# Patient Record
Sex: Female | Born: 1975 | Race: White | Hispanic: No | Marital: Married | Smoking: Never smoker
Health system: Southern US, Community
[De-identification: ages and names within clinical notes are randomized; demographics above are authoritative.]

## PROBLEM LIST (undated history)

## (undated) DIAGNOSIS — Z8489 Family history of other specified conditions: Secondary | ICD-10-CM

## (undated) DIAGNOSIS — I7771 Dissection of carotid artery: Secondary | ICD-10-CM

## (undated) DIAGNOSIS — T7840XA Allergy, unspecified, initial encounter: Secondary | ICD-10-CM

## (undated) DIAGNOSIS — I773 Arterial fibromuscular dysplasia: Secondary | ICD-10-CM

## (undated) DIAGNOSIS — Z9889 Other specified postprocedural states: Secondary | ICD-10-CM

## (undated) DIAGNOSIS — R112 Nausea with vomiting, unspecified: Secondary | ICD-10-CM

## (undated) DIAGNOSIS — M810 Age-related osteoporosis without current pathological fracture: Secondary | ICD-10-CM

## (undated) DIAGNOSIS — R202 Paresthesia of skin: Secondary | ICD-10-CM

## (undated) DIAGNOSIS — R51 Headache: Secondary | ICD-10-CM

## (undated) DIAGNOSIS — E162 Hypoglycemia, unspecified: Secondary | ICD-10-CM

## (undated) DIAGNOSIS — N809 Endometriosis, unspecified: Secondary | ICD-10-CM

## (undated) DIAGNOSIS — G8929 Other chronic pain: Secondary | ICD-10-CM

## (undated) DIAGNOSIS — I639 Cerebral infarction, unspecified: Secondary | ICD-10-CM

## (undated) HISTORY — DX: Hypoglycemia, unspecified: E16.2

## (undated) HISTORY — DX: Other chronic pain: G89.29

## (undated) HISTORY — PX: WISDOM TOOTH EXTRACTION: SHX21

## (undated) HISTORY — PX: APPENDECTOMY: SHX54

## (undated) HISTORY — DX: Arterial fibromuscular dysplasia: I77.3

## (undated) HISTORY — DX: Dissection of carotid artery: I77.71

## (undated) HISTORY — DX: Cerebral infarction, unspecified: I63.9

## (undated) HISTORY — DX: Paresthesia of skin: R20.2

## (undated) HISTORY — DX: Endometriosis, unspecified: N80.9

## (undated) HISTORY — DX: Age-related osteoporosis without current pathological fracture: M81.0

## (undated) HISTORY — DX: Allergy, unspecified, initial encounter: T78.40XA

## (undated) HISTORY — PX: DILATION AND CURETTAGE OF UTERUS: SHX78

## (undated) HISTORY — DX: Headache: R51

---

## 2002-04-02 ENCOUNTER — Other Ambulatory Visit: Admission: RE | Admit: 2002-04-02 | Discharge: 2002-04-02 | Payer: Self-pay | Admitting: *Deleted

## 2003-06-14 ENCOUNTER — Other Ambulatory Visit: Admission: RE | Admit: 2003-06-14 | Discharge: 2003-06-14 | Payer: Self-pay | Admitting: Obstetrics and Gynecology

## 2004-03-16 ENCOUNTER — Ambulatory Visit: Payer: Self-pay | Admitting: Family Medicine

## 2004-06-16 ENCOUNTER — Other Ambulatory Visit: Admission: RE | Admit: 2004-06-16 | Discharge: 2004-06-16 | Payer: Self-pay | Admitting: Obstetrics and Gynecology

## 2005-04-23 ENCOUNTER — Other Ambulatory Visit: Admission: RE | Admit: 2005-04-23 | Discharge: 2005-04-23 | Payer: Self-pay | Admitting: Obstetrics and Gynecology

## 2005-11-22 ENCOUNTER — Ambulatory Visit: Payer: Self-pay | Admitting: Family Medicine

## 2006-06-22 ENCOUNTER — Ambulatory Visit: Payer: Self-pay | Admitting: Family Medicine

## 2006-06-22 LAB — CONVERTED CEMR LAB
Albumin: 4 g/dL (ref 3.5–5.2)
BUN: 7 mg/dL (ref 6–23)
Basophils Absolute: 0 10*3/uL (ref 0.0–0.1)
Basophils Relative: 0.4 % (ref 0.0–1.0)
CO2: 30 meq/L (ref 19–32)
Chloride: 111 meq/L (ref 96–112)
Cholesterol: 201 mg/dL (ref 0–200)
Creatinine, Ser: 0.7 mg/dL (ref 0.4–1.2)
Direct LDL: 75.1 mg/dL
HCT: 39 % (ref 36.0–46.0)
Hemoglobin: 13.8 g/dL (ref 12.0–15.0)
Monocytes Absolute: 0.4 10*3/uL (ref 0.2–0.7)
Monocytes Relative: 10.6 % (ref 3.0–11.0)
Potassium: 4 meq/L (ref 3.5–5.1)
RBC: 4.29 M/uL (ref 3.87–5.11)
RDW: 11.9 % (ref 11.5–14.6)
Sodium: 144 meq/L (ref 135–145)
TSH: 1.45 microintl units/mL (ref 0.35–5.50)
Total Bilirubin: 1.1 mg/dL (ref 0.3–1.2)
Total CHOL/HDL Ratio: 2
Total Protein: 6.9 g/dL (ref 6.0–8.3)
Triglycerides: 61 mg/dL (ref 0–149)
VLDL: 12 mg/dL (ref 0–40)

## 2006-07-15 ENCOUNTER — Ambulatory Visit: Payer: Self-pay | Admitting: Family Medicine

## 2006-07-26 ENCOUNTER — Encounter: Payer: Self-pay | Admitting: Family Medicine

## 2007-05-03 ENCOUNTER — Encounter: Admission: RE | Admit: 2007-05-03 | Discharge: 2007-05-03 | Payer: Self-pay | Admitting: Allergy and Immunology

## 2008-02-01 ENCOUNTER — Ambulatory Visit: Payer: Self-pay | Admitting: Family Medicine

## 2008-02-01 DIAGNOSIS — J45909 Unspecified asthma, uncomplicated: Secondary | ICD-10-CM | POA: Insufficient documentation

## 2008-02-01 DIAGNOSIS — R519 Headache, unspecified: Secondary | ICD-10-CM | POA: Insufficient documentation

## 2008-02-01 DIAGNOSIS — Z8639 Personal history of other endocrine, nutritional and metabolic disease: Secondary | ICD-10-CM

## 2008-02-01 DIAGNOSIS — R51 Headache: Secondary | ICD-10-CM

## 2008-02-01 DIAGNOSIS — Z862 Personal history of diseases of the blood and blood-forming organs and certain disorders involving the immune mechanism: Secondary | ICD-10-CM

## 2008-02-01 DIAGNOSIS — J019 Acute sinusitis, unspecified: Secondary | ICD-10-CM

## 2008-02-01 DIAGNOSIS — J309 Allergic rhinitis, unspecified: Secondary | ICD-10-CM | POA: Insufficient documentation

## 2008-02-02 ENCOUNTER — Ambulatory Visit: Payer: Self-pay | Admitting: Family Medicine

## 2008-02-02 ENCOUNTER — Telehealth: Payer: Self-pay | Admitting: Family Medicine

## 2008-02-17 ENCOUNTER — Emergency Department (HOSPITAL_BASED_OUTPATIENT_CLINIC_OR_DEPARTMENT_OTHER): Admission: EM | Admit: 2008-02-17 | Discharge: 2008-02-17 | Payer: Self-pay | Admitting: Emergency Medicine

## 2008-02-17 ENCOUNTER — Ambulatory Visit: Payer: Self-pay | Admitting: Radiology

## 2008-03-20 ENCOUNTER — Ambulatory Visit: Payer: Self-pay | Admitting: Family Medicine

## 2008-11-15 ENCOUNTER — Ambulatory Visit: Payer: Self-pay | Admitting: Family Medicine

## 2008-11-15 DIAGNOSIS — T753XXA Motion sickness, initial encounter: Secondary | ICD-10-CM

## 2009-01-27 ENCOUNTER — Ambulatory Visit: Payer: Self-pay | Admitting: Family Medicine

## 2009-01-27 DIAGNOSIS — G4700 Insomnia, unspecified: Secondary | ICD-10-CM | POA: Insufficient documentation

## 2009-01-29 ENCOUNTER — Ambulatory Visit: Payer: Self-pay | Admitting: Family Medicine

## 2009-01-29 DIAGNOSIS — R079 Chest pain, unspecified: Secondary | ICD-10-CM | POA: Insufficient documentation

## 2009-01-30 ENCOUNTER — Ambulatory Visit: Payer: Self-pay | Admitting: Internal Medicine

## 2009-02-03 ENCOUNTER — Telehealth: Payer: Self-pay | Admitting: Family Medicine

## 2009-02-03 ENCOUNTER — Telehealth (INDEPENDENT_AMBULATORY_CARE_PROVIDER_SITE_OTHER): Payer: Self-pay | Admitting: *Deleted

## 2009-02-05 ENCOUNTER — Telehealth: Payer: Self-pay | Admitting: Family Medicine

## 2009-02-06 ENCOUNTER — Encounter: Payer: Self-pay | Admitting: Family Medicine

## 2010-01-10 ENCOUNTER — Ambulatory Visit
Admission: RE | Admit: 2010-01-10 | Discharge: 2010-01-10 | Payer: Self-pay | Source: Home / Self Care | Attending: Family Medicine | Admitting: Family Medicine

## 2010-01-12 ENCOUNTER — Ambulatory Visit
Admission: RE | Admit: 2010-01-12 | Discharge: 2010-01-12 | Payer: Self-pay | Source: Home / Self Care | Attending: Family Medicine | Admitting: Family Medicine

## 2010-02-01 LAB — CONVERTED CEMR LAB
Albumin: 4.4 g/dL (ref 3.5–5.2)
BUN: 7 mg/dL (ref 6–23)
Basophils Absolute: 0 10*3/uL (ref 0.0–0.1)
Basophils Relative: 0 % (ref 0.0–3.0)
CO2: 28 meq/L (ref 19–32)
Calcium: 9.7 mg/dL (ref 8.4–10.5)
Chloride: 105 meq/L (ref 96–112)
Creatinine, Ser: 0.8 mg/dL (ref 0.4–1.2)
Eosinophils Absolute: 0 10*3/uL (ref 0.0–0.7)
Free T4: 0.7 ng/dL (ref 0.6–1.6)
Glucose, Bld: 97 mg/dL (ref 70–99)
Lymphocytes Relative: 7.9 % — ABNORMAL LOW (ref 12.0–46.0)
MCHC: 33.7 g/dL (ref 30.0–36.0)
MCV: 93.7 fL (ref 78.0–100.0)
Monocytes Absolute: 0.4 10*3/uL (ref 0.1–1.0)
Neutrophils Relative %: 88.3 % — ABNORMAL HIGH (ref 43.0–77.0)
RBC: 4.51 M/uL (ref 3.87–5.11)
RDW: 12.3 % (ref 11.5–14.6)
T3, Free: 2.7 pg/mL (ref 2.3–4.2)
TSH: 0.59 microintl units/mL (ref 0.35–5.50)
Total Protein: 7.5 g/dL (ref 6.0–8.3)

## 2010-02-03 NOTE — Progress Notes (Signed)
Summary: Pt req a letter for medical leave of absence to [facility]  Phone Note Call from Patient Call back at Home Phone [phone]   Caller: Patient Summary of Call: Pt called and said that she has had to discontine her current classes at [employer] on account of her illness and present condition. Pt is req that Dr. Clent Ridges write a medical leave of absense so it does not go against pts academic record. Please fax letter to [phone] or pt can pick up letter if needed.  Initial call taken by: Lucy Antigua,  February 05, 2009 2:28 PM  Follow-up for Phone Call        I will be happy to do this but ask her when was the last day she went to class, and when does she expect to go back? Follow-up by: Nelwyn Salisbury MD,  February 06, 2009 8:28 AM  Additional Follow-up for Phone Call Additional follow up Details #1::        requesting medical leave for the spring semester cause she has already exceeded her days of being out.  Her last day was 01/23/09 Additional Follow-up by: Alfred Levins, CMA,  February 06, 2009 11:19 AM    Additional Follow-up for Phone Call Additional follow up Details #2::    please tell her that I wrote this letter today and that we will fax it in as above Follow-up by: Nelwyn Salisbury MD,  February 06, 2009 12:02 PM  Additional Follow-up for Phone Call Additional follow up Details #3:: Details for Additional Follow-up Action Taken: pt aware Additional Follow-up by: Alfred Levins, CMA,  February 06, 2009 12:20 PM

## 2010-02-03 NOTE — Letter (Signed)
Summary: Excuse from Class for Spring Semester  Excuse from Class for Spring Semester   Imported By: Maryln Gottron 02/10/2009 11:24:24  _____________________________________________________________________  External Attachment:    Type:   Image     Comment:   External Document

## 2010-02-03 NOTE — Assessment & Plan Note (Signed)
Summary: ASTHMA RELATED ISSUES // RS   Vital Signs:  Patient profile:   35 year old female Weight:      101 pounds Temp:     97.9 degrees F oral BP sitting:   116 / 90  (left arm) Cuff size:   regular  Vitals Entered By: Alfred Levins, CMA (January 27, 2009 2:24 PM) CC: asthma x1 wk, physically tired but can't sleep   History of Present Illness: Here with questions about asthma and sleep problems. She has had some trouble with sleep off and on for years, usually falling asleep quickly but waking up in the middle of the night. She would like to avoid sleeping meds if possible. She does avoid any caffeine use except in the early mornings. Also she asks if she can come off any of her asthma meds. She sees Dr. Corinda Gubler for this, and her asthma has been very well controlled. She has not used her rescue inhaler more than one time in the past 3 months. She has had a flare of her allergies in the past week with itchy eyes and a stuffy head. No fever.   Current Medications (verified): 1)  Allegra 180 Mg Tabs (Fexofenadine Hcl) .Marland Kitchen.. 1 By Mouth Once Daily 2)  Yaz 3-0.02 Mg  Tabs (Drospirenone-Ethinyl Estradiol) .Marland Kitchen.. 1 By Mouth Once Daily 3)  Symbicort 80-4.5 Mcg/act  Aero (Budesonide-Formoterol Fumarate) .Marland Kitchen.. 1 Puff Two Times A Day 4)  Proair Hfa 108 (90 Base) Mcg/act Aers (Albuterol Sulfate) .... 2 Puffs Q 4 Hours As Needed  Allergies (verified): 1)  ! * Omnicef 2)  ! Codeine 3)  ! Ceftin  Past History:  Past Medical History: Reviewed history from 11/15/2008 and no changes required. Allergic rhinitis Asthma, sees Dr. Stevphen Rochester Headache Chickenpox Hypoglycemia sees Dr. Su Hilt for GYN exams  Review of Systems  The patient denies anorexia, fever, weight loss, weight gain, vision loss, decreased hearing, hoarseness, chest pain, syncope, dyspnea on exertion, peripheral edema, prolonged cough, headaches, hemoptysis, abdominal pain, melena, hematochezia, severe indigestion/heartburn,  hematuria, incontinence, genital sores, muscle weakness, suspicious skin lesions, transient blindness, difficulty walking, depression, unusual weight change, abnormal bleeding, enlarged lymph nodes, angioedema, breast masses, and testicular masses.    Physical Exam  General:  Well-developed,well-nourished,in no acute distress; alert,appropriate and cooperative throughout examination Head:  Normocephalic and atraumatic without obvious abnormalities. No apparent alopecia or balding. Eyes:  No corneal or conjunctival inflammation noted. EOMI. Perrla. Funduscopic exam benign, without hemorrhages, exudates or papilledema. Vision grossly normal. Ears:  External ear exam shows no significant lesions or deformities.  Otoscopic examination reveals clear canals, tympanic membranes are intact bilaterally without bulging, retraction, inflammation or discharge. Hearing is grossly normal bilaterally. Nose:  External nasal examination shows no deformity or inflammation. Nasal mucosa are pink and moist without lesions or exudates. Mouth:  Oral mucosa and oropharynx without lesions or exudates.  Teeth in good repair. Neck:  No deformities, masses, or tenderness noted. Lungs:  Normal respiratory effort, chest expands symmetrically. Lungs are clear to auscultation, no crackles or wheezes. Psych:  Oriented X3, memory intact for recent and remote, normally interactive, good eye contact, and not anxious appearing.     Impression & Recommendations:  Problem # 1:  ASTHMA (ICD-493.90)  The following medications were removed from the medication list:    Symbicort 80-4.5 Mcg/act Aero (Budesonide-formoterol fumarate) .Marland Kitchen... 1 puff two times a day Her updated medication list for this problem includes:    Proair Hfa 108 (90 Base) Mcg/act Aers (Albuterol sulfate) .Marland KitchenMarland KitchenMarland KitchenMarland Kitchen  2 puffs q 4 hours as needed    Flovent Hfa 44 Mcg/act Aero (Fluticasone propionate  hfa) .Marland Kitchen... 2 puffs once daily    Prednisone (pak) 10 Mg Tabs (Prednisone)  .Marland Kitchen... As directed for 6 days  Problem # 2:  ALLERGIC RHINITIS (ICD-477.9)  The following medications were removed from the medication list:    Promethazine Hcl 25 Mg Tabs (Promethazine hcl) .Marland Kitchen... 1 q 4 hours as needed nausea Her updated medication list for this problem includes:    Allegra 180 Mg Tabs (Fexofenadine hcl) .Marland Kitchen... 1 by mouth once daily  Problem # 3:  INSOMNIA (ICD-780.52)  Complete Medication List: 1)  Allegra 180 Mg Tabs (Fexofenadine hcl) .Marland Kitchen.. 1 by mouth once daily 2)  Yaz 3-0.02 Mg Tabs (Drospirenone-ethinyl estradiol) .Marland Kitchen.. 1 by mouth once daily 3)  Proair Hfa 108 (90 Base) Mcg/act Aers (Albuterol sulfate) .... 2 puffs q 4 hours as needed 4)  Flovent Hfa 44 Mcg/act Aero (Fluticasone propionate  hfa) .... 2 puffs once daily 5)  Prednisone (pak) 10 Mg Tabs (Prednisone) .... As directed for 6 days  Patient Instructions: 1)  Her asthma seems well controlled but instead of stopping her maintenance inhaler altogether, we will switch to a maintenance steroid inhaler and get rid of the long term beta agonist. try melatonin OTC for sleep. 2)  Please schedule a follow-up appointment in 2 weeks.  Prescriptions: PREDNISONE (PAK) 10 MG TABS (PREDNISONE) as directed for 6 days  #1 x 0   Entered and Authorized by:   Nelwyn Salisbury MD   Signed by:   Nelwyn Salisbury MD on 01/27/2009   Method used:   Electronically to        [facility] # [address]* (retail)       [address]       [place], [place]  [address]       Ph: [phone]       Fax: [phone]   RxID:   [id number]

## 2010-02-03 NOTE — Progress Notes (Signed)
Summary: Pt needs to know if she needs to keep ov on 02/10/09  Phone Note Call from Patient Call back at Home Phone [phone]   Caller: Patient Summary of Call: Pt is sch for a follow up appt on 02/10/09 at 3:30. Pt is wondering if she still needs to keep this appt, since Dr. Clent Ridges has now referred her to an alergist.  Pt has appt with Dr. Stevphen Rochester tomorrow. Please adivise.  Initial call taken by: Lucy Antigua,  February 03, 2009 9:07 AM  Follow-up for Phone Call        appt cancelled, pt aware Follow-up by: Alfred Levins, CMA,  February 03, 2009 9:11 AM

## 2010-02-03 NOTE — Assessment & Plan Note (Signed)
Summary: chest pain/dm   Vital Signs:  Patient profile:   35 year old female Height:      60 inches Weight:      101 pounds BMI:     19.80 O2 Sat:      99 % on Room air Temp:     98.9 degrees F oral Pulse rate:   105 / minute BP sitting:   140 / 106  (left arm) Cuff size:   regular  Vitals Entered By: Alfred Levins, CMA (January 29, 2009 4:11 PM)  O2 Flow:  Room air CC: chest pain, sob, sweating alot, feels like legs are shaking inside   History of Present Illness: Here with her husband for a number of symptoms that have worsened in the past 48 hours. I saw her here 2 days ago with some conerns over sleep problems and questions about asthma. The asthma seemed to be doing well, so we switched from Symbicort to Flovent. She had some allergy problems with sneezing, itchy eyes , etc. so we started her on a prednisone taper pack. Now over the past 48 hours she has developed some yellow nasal DC, a dry cough, chills, some SOB, and sharp pains across the chest. Pains are constant but they wax and wane. It hurts to take a deep breath. No nausea or heartburn. her heart rate is quite rapid, and she is sweating a lot. She has used her albuterol inhaler about 10 times in the past 2 days.   Current Medications (verified): 1)  Allegra 180 Mg Tabs (Fexofenadine Hcl) .Marland Kitchen.. 1 By Mouth Once Daily 2)  Yaz 3-0.02 Mg  Tabs (Drospirenone-Ethinyl Estradiol) .Marland Kitchen.. 1 By Mouth Once Daily 3)  Proair Hfa 108 (90 Base) Mcg/act Aers (Albuterol Sulfate) .... 2 Puffs Q 4 Hours As Needed 4)  Flovent Hfa 44 Mcg/act Aero (Fluticasone Propionate  Hfa) .... 2 Puffs Once Daily 5)  Prednisone (Pak) 10 Mg Tabs (Prednisone) .... As Directed For 6 Days  Allergies (verified): 1)  ! * Omnicef 2)  ! Codeine 3)  ! Ceftin  Past History:  Past Medical History: Reviewed history from 11/15/2008 and no changes required. Allergic rhinitis Asthma, sees Dr. Stevphen Rochester Headache Chickenpox Hypoglycemia sees Dr. Su Hilt for  GYN exams  Past Surgical History: Reviewed history from 02/01/2008 and no changes required. Appendectomy  Review of Systems  The patient denies anorexia, fever, weight loss, weight gain, vision loss, decreased hearing, hoarseness, syncope, peripheral edema, headaches, hemoptysis, abdominal pain, melena, hematochezia, severe indigestion/heartburn, hematuria, incontinence, genital sores, muscle weakness, suspicious skin lesions, transient blindness, difficulty walking, depression, unusual weight change, abnormal bleeding, enlarged lymph nodes, angioedema, breast masses, and testicular masses.    Physical Exam  General:  alert, sweaty, very anxious Head:  Normocephalic and atraumatic without obvious abnormalities. No apparent alopecia or balding. Eyes:  No corneal or conjunctival inflammation noted. EOMI. Perrla. Funduscopic exam benign, without hemorrhages, exudates or papilledema. Vision grossly normal. Ears:  External ear exam shows no significant lesions or deformities.  Otoscopic examination reveals clear canals, tympanic membranes are intact bilaterally without bulging, retraction, inflammation or discharge. Hearing is grossly normal bilaterally. Nose:  External nasal examination shows no deformity or inflammation. Nasal mucosa are pink and moist without lesions or exudates. Mouth:  Oral mucosa and oropharynx without lesions or exudates.  Teeth in good repair. Neck:  No deformities, masses, or tenderness noted. Chest Wall:  very tender across the entire chest Lungs:  Normal respiratory effort, chest expands symmetrically. Lungs are clear to  auscultation, no crackles or wheezes. Heart:  rapid  rate and regular rhythm. S1 and S2 normal without gallop, murmur, click, rub or other extra sounds. EKG normal Abdomen:  Bowel sounds positive,abdomen soft and non-tender without masses, organomegaly or hernias noted.   Impression & Recommendations:  Problem # 1:  CHEST PAIN  (ICD-786.50)  Orders: Venipuncture ([address]) TLB-BMP (Basic Metabolic Panel-BMET) (80048-METABOL) TLB-CBC Platelet - w/Differential (85025-CBCD) TLB-Hepatic/Liver Function Pnl (80076-HEPATIC) TLB-TSH (Thyroid Stimulating Hormone) (84443-TSH) TLB-T4 (Thyrox), Free (84439-FT4R) TLB-T3, Free (Triiodothyronine) (84481-T3FREE) EKG w/ Interpretation (93000) Radiology Referral (Radiology)  Problem # 2:  INSOMNIA (ICD-780.52)  Problem # 3:  ASTHMA (ICD-493.90)  Her updated medication list for this problem includes:    Proair Hfa 108 (90 Base) Mcg/act Aers (Albuterol sulfate) .Marland Kitchen... 2 puffs q 4 hours as needed    Flovent Hfa 44 Mcg/act Aero (Fluticasone propionate  hfa) .Marland Kitchen... 2 puffs once daily    Prednisone (pak) 10 Mg Tabs (Prednisone) .Marland Kitchen... As directed for 6 days  Problem # 4:  ALLERGIC RHINITIS (ICD-477.9)  Her updated medication list for this problem includes:    Allegra 180 Mg Tabs (Fexofenadine hcl) .Marland Kitchen... 1 by mouth once daily  Complete Medication List: 1)  Allegra 180 Mg Tabs (Fexofenadine hcl) .Marland Kitchen.. 1 by mouth once daily 2)  Yaz 3-0.02 Mg Tabs (Drospirenone-ethinyl estradiol) .Marland Kitchen.. 1 by mouth once daily 3)  Proair Hfa 108 (90 Base) Mcg/act Aers (Albuterol sulfate) .... 2 puffs q 4 hours as needed 4)  Flovent Hfa 44 Mcg/act Aero (Fluticasone propionate  hfa) .... 2 puffs once daily 5)  Prednisone (pak) 10 Mg Tabs (Prednisone) .... As directed for 6 days  Patient Instructions: 1)  Her chest pain is consistent with costochondritis, which has come on the heels of a viral URI. This would explain the chills, cough, sweating, etc. The rapid heart rate may come from the prednisone and/or using too much albuterol. Since she has been on a BCP for awhile, there is a possibility of PE as well, though less likely given her normal oxygen sats today. Advised her to stop using albuterol unless she truly feels tight or if she is wheezing. Finish out the prednisone pack. Get labs today to check her  thyroid status and a CBC. Will set up a contrasted chest CT as well to rule out PE. She knows to go to the ER tonight if she gets more SOB or the pain gets any worse.

## 2010-02-03 NOTE — Progress Notes (Signed)
Summary: Pt coming in tomorrow to pick up copy of recent ov notes and lab  Phone Note Call from Patient Call back at Home Phone [phone]   Caller: Patient Summary of Call: Pt is going to come by the office tomorrow morning to sign a release to get copy of recent ov notes and labs, because she has a doctors appt tomorrow at 11:30 w/ Dr. Stevphen Rochester.  Initial call taken by: Lucy Antigua,  February 03, 2009 1:33 PM  Follow-up for Phone Call       Follow-up by: Alfred Levins, CMA,  February 03, 2009 1:52 PM  Additional Follow-up for Phone Call Additional follow up Details #1::        Phone Call Completed Additional Follow-up by: Drue Stager,  February 05, 2009 2:34 PM

## 2010-02-05 NOTE — Assessment & Plan Note (Signed)
Summary: CANT TALK/VJ   Vital Signs:  Patient profile:   35 year old female Height:      60 inches Weight:      91 pounds BMI:     17.84 O2 Sat:      97 % on Room air Temp:     98.4 degrees F oral Pulse rate:   95 / minute BP sitting:   110 / 70  (left arm) Cuff size:   regular  Vitals Entered By: Payton Spark CMA (January 10, 2010 11:14 AM)  O2 Flow:  Room air CC: Allergies, fatigued, and hoarse x 5 days.   CC:  Allergies, fatigued, and and hoarse x 5 days.Marland Kitchen  History of Present Illness: R is a 35 y/o fem ns w h/o of ar/asthma who comes in for eval of flare of athma x 4 days.triggered gby a viral inf...schoolmteacher  Current Medications (verified): 1)  Allegra 180 Mg Tabs (Fexofenadine Hcl) .Marland Kitchen.. 1 By Mouth Once Daily 2)  Proair Hfa 108 (90 Base) Mcg/act Aers (Albuterol Sulfate) .... 2 Puffs Q 4 Hours As Needed 3)  Flovent Hfa 44 Mcg/act Aero (Fluticasone Propionate  Hfa) .... 2 Puffs Once Daily 4)  Allegra-D Allergy & Congestion 180-240 Mg Xr24h-Tab (Fexofenadine-Pseudoephedrine) .... Take 1 Tab By Mouth Once Daily  Allergies (verified): 1)  ! * Omnicef 2)  ! Codeine 3)  ! Ceftin  Past History:  Past medical, surgical, family and social histories (including risk factors) reviewed for relevance to current acute and chronic problems.  Past Medical History: Reviewed history from 11/15/2008 and no changes required. Allergic rhinitis Asthma, sees Dr. Stevphen Rochester Headache Chickenpox Hypoglycemia sees Dr. Su Hilt for GYN exams  Past Surgical History: Reviewed history from 02/01/2008 and no changes required. Appendectomy  Family History: Reviewed history from 02/01/2008 and no changes required. Family History Hypertension Hypoglycemia Family History of CAD Female 1st degree relative <50 Family History Diabetes 1st degree relative  Social History: Reviewed history from 02/01/2008 and no changes required. Married Never Smoked Alcohol use-no Drug  use-no  Review of Systems      See HPI  Physical Exam  General:  Well-developed,well-nourished,in no acute distress; alert,appropriate and cooperative throughout examination Head:  Normocephalic and atraumatic without obvious abnormalities. No apparent alopecia or balding. Eyes:  No corneal or conjunctival inflammation noted. EOMI. Perrla. Funduscopic exam benign, without hemorrhages, exudates or papilledema. Vision grossly normal. Ears:  External ear exam shows no significant lesions or deformities.  Otoscopic examination reveals clear canals, tympanic membranes are intact bilaterally without bulging, retraction, inflammation or discharge. Hearing is grossly normal bilaterally. Nose:  External nasal examination shows no deformity or inflammation. Nasal mucosa are pink and moist without lesions or exudates. Mouth:  Oral mucosa and oropharynx without lesions or exudates.  Teeth in good repair. Neck:  No deformities, masses, or tenderness noted. Chest Wall:  No deformities, masses, or tenderness noted. Lungs:  symBS.....bilat. wheezing..mild   Impression & Recommendations:  Problem # 1:  ASTHMA (ICD-493.90) Assessment Deteriorated  Her updated medication list for this problem includes:    Proair Hfa 108 (90 Base) Mcg/act Aers (Albuterol sulfate) .Marland Kitchen... 2 puffs q 4 hours as needed    Flovent Hfa 44 Mcg/act Aero (Fluticasone propionate  hfa) .Marland Kitchen... 2 puffs once daily    Prednisone 20 Mg Tabs (Prednisone) ..... Uad  Complete Medication List: 1)  Allegra 180 Mg Tabs (Fexofenadine hcl) .Marland Kitchen.. 1 by mouth once daily 2)  Proair Hfa 108 (90 Base) Mcg/act Aers (Albuterol sulfate) .Marland KitchenMarland KitchenMarland Kitchen  2 puffs q 4 hours as needed 3)  Flovent Hfa 44 Mcg/act Aero (Fluticasone propionate  hfa) .... 2 puffs once daily 4)  Allegra-d Allergy & Congestion 180-240 Mg Xr24h-tab (Fexofenadine-pseudoephedrine) .... Take 1 tab by mouth once daily 5)  Prednisone 20 Mg Tabs (Prednisone) .... Uad 6)  Hydromet 5-1.5 Mg/41ml Syrp  (Hydrocodone-homatropine) .... 1/2 to 1 tsp three times a day prn  Patient Instructions: 1)  3 pred. now,, then 2 qam 2)  drink lots of water 3)  cont allg meds 4)  hydromet 1/2 to 1 tsp three times a day as needed 5)  see Dr. Clent Ridges mon for follow up Prescriptions: HYDROMET 5-1.5 MG/5ML SYRP (HYDROCODONE-HOMATROPINE) 1/2 to 1 tsp three times a day prn  #8oz x 1   Entered and Authorized by:   Roderick Pee MD   Signed by:   Roderick Pee MD on 01/10/2010   Method used:   Print then Give to Patient   RxID:   [id number] PREDNISONE 20 MG TABS (PREDNISONE) UAD  #50 x 1   Entered and Authorized by:   Roderick Pee MD   Signed by:   Roderick Pee MD on 01/10/2010   Method used:   Electronically to        [facility] [id number]* (retail)       [address]       [place], [place]  [address]       Ph: [phone]       Fax: [phone]   RxID:   [id number]    Orders Added: 1)  Est. Patient Level III [78242]

## 2010-02-05 NOTE — Assessment & Plan Note (Signed)
Summary: [facility]US [facility]FE[facility]//[facility]   Vital Signs:  Patient profile:   35 year old female Weight:      114 pounds O2 Sat:      99 % Temp:     98.8 degrees F Pulse rate:   114 / minute BP sitting:   110 / 74  (left arm)  Vitals Entered By: Pura Spice, RN (January 12, 2010 1:26 PM) CC: was [facility] inf. was seen by Dr Tawanna Cooler was given prednisone and hycodan    History of Present Illness: Here for one week of sinus pressure, PND, ST, hoarseness, and coughing up green sputum. No fever. Was seen in the Sat. clinic, and was given cough syrup and a prednisone dose pack. She is no better today.   Allergies: 1)  ! * Omnicef 2)  ! Codeine 3)  ! Ceftin  Past History:  Past Medical History: Reviewed history from 11/15/2008 and no changes required. Allergic rhinitis Asthma, sees Dr. Stevphen [place] Headache Chickenpox Hypoglycemia sees Dr. Su Hilt for GYN exams  Review of Systems  The patient denies anorexia, fever, weight loss, weight gain, vision loss, decreased hearing, hoarseness, chest pain, syncope, dyspnea on exertion, peripheral edema, hemoptysis, abdominal pain, melena, hematochezia, severe indigestion/heartburn, hematuria, incontinence, genital sores, muscle weakness, suspicious skin lesions, transient blindness, difficulty walking, depression, unusual weight change, abnormal bleeding, enlarged lymph nodes, angioedema, breast masses, and testicular masses.    Physical Exam  General:  Well-developed,well-nourished,in no acute distress; alert,appropriate and cooperative throughout examination Head:  Normocephalic and atraumatic without obvious abnormalities. No apparent alopecia or balding. Eyes:  No corneal or conjunctival inflammation noted. EOMI. Perrla. Funduscopic exam benign, without hemorrhages, exudates or papilledema. Vision grossly normal. Ears:  External ear exam shows no significant lesions or deformities.  Otoscopic examination  reveals clear canals, tympanic membranes are intact bilaterally without bulging, retraction, inflammation or discharge. Hearing is grossly normal bilaterally. Nose:  External nasal examination shows no deformity or inflammation. Nasal mucosa are pink and moist without lesions or exudates. Mouth:  Oral mucosa and oropharynx without lesions or exudates.  Teeth in good repair. Neck:  No deformities, masses, or tenderness noted. Lungs:  soft wheezes. Very hoarse    Impression & Recommendations:  Problem # 1:  ACUTE SINUSITIS, UNSPECIFIED (ICD-461.9)  The following medications were removed from the medication list:    Allegra-d Allergy & Congestion 180-240 Mg Xr24h-tab (Fexofenadine-pseudoephedrine) .Marland Kitchen... Take 1 tab by mouth once daily    Hydromet 5-1.5 Mg/36ml Syrp (Hydrocodone-homatropine) .Marland Kitchen... 1/2 to 1 tsp three times a day prn Her updated medication list for this problem includes:    Allegra-d Allergy & Congestion 180-240 Mg Xr24h-tab (Fexofenadine-pseudoephedrine) ..... Once daily    Zithromax Z-pak 250 Mg Tabs (Azithromycin) .Marland Kitchen... As directed  Orders: Depo- Medrol 40mg  (J1030) Depo- Medrol 80mg  (J1040) Admin of Therapeutic Inj  intramuscular or subcutaneous (43329)  Complete Medication List: 1)  Proair Hfa 108 (90 Base) Mcg/act Aers (Albuterol sulfate) .... 2 puffs q 4 hours as needed 2)  Flovent Hfa 44 Mcg/act Aero (Fluticasone propionate  hfa) .... 2 puffs two times a day 3)  Allegra-d Allergy & Congestion 180-240 Mg Xr24h-tab (Fexofenadine-pseudoephedrine) .... Once daily 4)  Zithromax Z-pak 250 Mg Tabs (Azithromycin) .... As directed  Patient Instructions: 1)  Given a Zpack. Given  steroid shot, and she will stop the prednisone tablets. Stop the cough syrup and use Delsym instead. Off work today and tomorrow.  Prescriptions: ZITHROMAX Z-PAK 250  MG TABS (AZITHROMYCIN) as directed  #1 x 0   Entered and Authorized by:   Nelwyn Salisbury MD   Signed by:   Nelwyn Salisbury MD on  01/12/2010   Method used:   Print then Give to Patient   RxID:   [id number] ALLEGRA-D ALLERGY & CONGESTION 180-240 MG XR24H-TAB (FEXOFENADINE-PSEUDOEPHEDRINE) once daily  #30 x 11   Entered and Authorized by:   Nelwyn Salisbury MD   Signed by:   Nelwyn Salisbury MD on 01/12/2010   Method used:   Print then Give to Patient   RxID:   [id number]    Medication Administration  Injection # 1:    Medication: Depo- Medrol 40mg     Diagnosis: ACUTE SINUSITIS, UNSPECIFIED (ICD-461.9)    Route: IM    Site: RUOQ gluteus    Exp Date: 07/2012    Lot #: [id number]    Mfr: Pharmacia    Patient tolerated injection without complications    Given by: Pura Spice, RN (January 12, 2010 2:41 PM)  Injection # 2:    Medication: Depo- Medrol 80mg     Diagnosis: ACUTE SINUSITIS, UNSPECIFIED (ICD-461.9)    Route: IM    Site: RUOQ gluteus    Exp Date: 07/2012    Lot #: [id number]    Mfr: Pharmacia    Patient tolerated injection without complications    Given by: Pura Spice, RN (January 12, 2010 2:42 PM)  Orders Added: 1)  Est. Patient Level IV [95284] 2)  Depo- Medrol 40mg  [J1030] 3)  Depo- Medrol 80mg  [J1040] 4)  Admin of Therapeutic Inj  intramuscular or subcutaneous [13244]

## 2010-05-11 ENCOUNTER — Ambulatory Visit (INDEPENDENT_AMBULATORY_CARE_PROVIDER_SITE_OTHER): Payer: BC Managed Care – PPO | Admitting: Family Medicine

## 2010-05-11 ENCOUNTER — Encounter: Payer: Self-pay | Admitting: Family Medicine

## 2010-05-11 ENCOUNTER — Telehealth: Payer: Self-pay | Admitting: *Deleted

## 2010-05-11 VITALS — BP 136/82 | HR 97 | Temp 97.9°F | Resp 14 | Wt 92.0 lb

## 2010-05-11 DIAGNOSIS — R5383 Other fatigue: Secondary | ICD-10-CM

## 2010-05-11 DIAGNOSIS — G4700 Insomnia, unspecified: Secondary | ICD-10-CM

## 2010-05-11 DIAGNOSIS — T148XXA Other injury of unspecified body region, initial encounter: Secondary | ICD-10-CM

## 2010-05-11 DIAGNOSIS — R531 Weakness: Secondary | ICD-10-CM

## 2010-05-11 DIAGNOSIS — J45901 Unspecified asthma with (acute) exacerbation: Secondary | ICD-10-CM

## 2010-05-11 LAB — CBC WITH DIFFERENTIAL/PLATELET
Basophils Relative: 0.6 % (ref 0.0–3.0)
Eosinophils Absolute: 0.1 10*3/uL (ref 0.0–0.7)
Eosinophils Relative: 1.5 % (ref 0.0–5.0)
HCT: 36.3 % (ref 36.0–46.0)
Hemoglobin: 12.8 g/dL (ref 12.0–15.0)
MCHC: 35.3 g/dL (ref 30.0–36.0)
MCV: 92.9 fl (ref 78.0–100.0)
Monocytes Absolute: 0.5 10*3/uL (ref 0.1–1.0)
Neutro Abs: 3.5 10*3/uL (ref 1.4–7.7)
RBC: 3.91 Mil/uL (ref 3.87–5.11)
WBC: 5.8 10*3/uL (ref 4.5–10.5)

## 2010-05-11 LAB — BASIC METABOLIC PANEL
GFR: 132.96 mL/min (ref >60.00)
Glucose, Bld: 70 mg/dL (ref 70–99)
Potassium: 3.6 mEq/L (ref 3.5–5.1)
Sodium: 135 mEq/L (ref 135–145)

## 2010-05-11 LAB — HEPATIC FUNCTION PANEL
AST: 24 U/L (ref 0–37)
Albumin: 4.1 g/dL (ref 3.5–5.2)
Alkaline Phosphatase: 36 U/L — ABNORMAL LOW (ref 39–117)
Bilirubin, Direct: 0.2 mg/dL (ref 0.0–0.3)
Total Bilirubin: 1.6 mg/dL — ABNORMAL HIGH (ref 0.3–1.2)

## 2010-05-11 LAB — POCT URINALYSIS DIPSTICK
Bilirubin, UA: NEGATIVE
Blood, UA: NEGATIVE
Glucose, UA: NEGATIVE
Ketones, UA: NEGATIVE
Spec Grav, UA: 1.005
pH, UA: 6

## 2010-05-11 MED ORDER — ALBUTEROL SULFATE HFA 108 (90 BASE) MCG/ACT IN AERS: 2.0000 | INHALATION_SPRAY | RESPIRATORY_TRACT | Status: DC | PRN

## 2010-05-11 MED ORDER — TEMAZEPAM 30 MG PO CAPS: 30.0000 mg | ORAL_CAPSULE | Freq: Every evening | ORAL | Status: AC | PRN

## 2010-05-11 NOTE — Telephone Encounter (Signed)
Pt's right leg has started to become to be numb and tingling the past 30 min.  Starts at toes and goes up mid -thigh.

## 2010-05-11 NOTE — Progress Notes (Signed)
  Subjective:    Patient ID: [name], female    DOB: 1975/12/30, 35 y.o.   MRN: [id number]  HPI Here for some insomnia that has bothered her off and on for a  Month. She feels "wired" and has a hard time sleeping. She has had a flare of her asthma lately, and her rescue inhaler rx has expired. She has had some bruising on her legs and this worries her. No other changes.    Review of Systems  Constitutional: Negative.   HENT: Negative.   Eyes: Negative.   Respiratory: Positive for shortness of breath and wheezing. Negative for cough.   Cardiovascular: Negative.   Gastrointestinal: Negative.   Hematological: Negative for adenopathy. Bruises/bleeds easily.  Psychiatric/Behavioral: Positive for sleep disturbance. Negative for decreased concentration. The patient is not nervous/anxious.        Objective:   Physical Exam  Constitutional: She appears well-developed and well-nourished.  Eyes: Conjunctivae are normal. Pupils are equal, round, and reactive to light. No scleral icterus.  Neck: Normal range of motion. Neck supple. No thyromegaly present.  Cardiovascular: Normal rate, regular rhythm, normal heart sounds and intact distal pulses.   Pulmonary/Chest: Effort normal and breath sounds normal.  Abdominal: Soft. Bowel sounds are normal.  Lymphadenopathy:    She has no cervical adenopathy.  Psychiatric: She has a normal mood and affect. Her behavior is normal.          Assessment & Plan:  Refilled her Proair. Try Temazepam for sleep. Get labs today

## 2010-05-11 NOTE — Telephone Encounter (Signed)
Husband calls back stating pt cannot walk with help.  Advised to go to ER ASAP.  No other neurological symptoms but pt seems extremely anxious.

## 2010-05-12 NOTE — Telephone Encounter (Signed)
Notified Dr. Clent Ridges.

## 2010-05-13 ENCOUNTER — Ambulatory Visit (INDEPENDENT_AMBULATORY_CARE_PROVIDER_SITE_OTHER): Payer: BC Managed Care – PPO | Admitting: Family Medicine

## 2010-05-13 ENCOUNTER — Encounter: Payer: Self-pay | Admitting: Family Medicine

## 2010-05-13 VITALS — BP 142/88 | HR 102 | Temp 98.7°F | Resp 14 | Wt 91.0 lb

## 2010-05-13 DIAGNOSIS — M79606 Pain in leg, unspecified: Secondary | ICD-10-CM

## 2010-05-13 DIAGNOSIS — M79609 Pain in unspecified limb: Secondary | ICD-10-CM

## 2010-05-13 DIAGNOSIS — R202 Paresthesia of skin: Secondary | ICD-10-CM

## 2010-05-13 DIAGNOSIS — R209 Unspecified disturbances of skin sensation: Secondary | ICD-10-CM

## 2010-05-13 NOTE — Progress Notes (Signed)
Pt had OV this Am. Informed.

## 2010-05-13 NOTE — Progress Notes (Signed)
  Subjective:    Patient ID: [name], female    DOB: 03/21/1975, 35 y.o.   MRN: [id number]  HPI Here to follow up on multiple issues. We saw her 2 days ago for some generalized weakness and some bruising over her skin. Her exam that day was normal, as were numerous screening labs. Then yesterday she developed intermittent sharp shooting pains in the right leg from the mid thigh down to the foot. This leg has been getting numb and tingly for several weeks prior to this but it had never had pain like this. No swelling. She then developed tingling all over her body including the lips. No SOB or chest pains. She went to [facility] ER and had a negative workup including more labs, a heat CT, and a venous doppler of the right leg. They told her she was hyperventilating and that anxiety was her main problem. Today she is back to see [place] with her husband, and she is still having intermittent sharp shooting pains in the right leg. No more body wide problems. She admits to being quite stressed lately with job issues.    Review of Systems  Constitutional: Negative.   HENT: Negative.   Eyes: Negative.   Respiratory: Negative.   Cardiovascular: Negative.   Gastrointestinal: Negative.   Genitourinary: Negative.   Musculoskeletal: Positive for myalgias.  Neurological: Negative.   Psychiatric/Behavioral: Negative for hallucinations, confusion, dysphoric mood, decreased concentration and agitation. The patient is nervous/anxious.        Objective:   Physical Exam  Constitutional: She is oriented to person, place, and time. She appears well-developed and well-nourished.  Cardiovascular: Normal rate, regular rhythm, normal heart sounds and intact distal pulses.   Pulmonary/Chest: Effort normal and breath sounds normal.  Musculoskeletal: Normal range of motion. She exhibits no edema and no tenderness.       The right leg is not tender or swollen  Neurological: She is alert and oriented to person,  place, and time. She has normal reflexes. No cranial nerve deficit. She exhibits normal muscle tone. Coordination normal.  Psychiatric: She has a normal mood and affect. Her behavior is normal.          Assessment & Plan:  I think her parasthesias yesterday were in fact due to hyperventilation syndrome, and today she seems fine. However the pain in her leg is real and we do not have an explanation for it. It seems to be neurologic in nature, so we will refer her to Neurology to evaluate this.

## 2010-05-18 ENCOUNTER — Telehealth: Payer: Self-pay | Admitting: *Deleted

## 2010-05-18 NOTE — Telephone Encounter (Signed)
Pt is continuing with numbness in all limbs, at times severe pain radiates down her leg to her foot.  Pulse rate is running over 100 at times.  She is not sleeping, and states that something has to be done.  She stays in bed all day at times.

## 2010-05-18 NOTE — Telephone Encounter (Signed)
Mom stated she has daughter bag pack. Mom is requesting neurologist referral.  asap

## 2010-05-18 NOTE — Telephone Encounter (Signed)
I already did a referral for her to see Neurology on 05-13-10. Please call her and discuss the specifics of this process

## 2010-05-19 NOTE — Assessment & Plan Note (Signed)
[facility] OFFICE NOTE   NAME:TSUIAnnalaura, Sauseda                          MRN:          [id number]  DATE:07/15/2006                            DOB:          1975/11/02    This is a 35 year old woman here for a non-gynecological physical  examination.   Generally, she feels fine but is concerned about some recent  fluctuations in her weight.  She gets very little regular exercise but  says her diet is healthy and fairly consistent.  From what she tells me,  I think her fluctuations are fairly unremarkable, but she seems quite  concerned about them.  Usually, her weight runs between 95-105 pounds  over the past few years.  However, on a couple of occasions she has  dropped down to as low as 84 pounds or 87 pounds over the past couple of  months.  She then tries to eat a little extra food, more so than normal,  and the her weight returns back to normal.  Again, she feels fine and  has no other complaints at all.  Her periods are quite regular.   She sees Dr. Osborn Coho on a regular basis for gynecology exams.  She saw her in March and that exam was normal.  Then in May, she saw Dr.  Su Hilt and had a full thyroid panel as well as a hormone panel drawn  and was told all of these were within normal limits.   I asked Chanda about any particular stresses in her life and whether  anxiety or stress may play a role in her weight fluctuations.  She  denies this and does not think that is a contributing factor.   For other details of her past medical history, family history, social  history, habits, etcetera, refer to our introductory note with her dated  February 15, 2003.   ALLERGIES:  1. CODEINE.  2. MUCINEX.   CURRENT MEDICATIONS:  1. Allegra 180 mg once a day.  2. Sudafed over-the-counter on an as needed basis.  3. Albuterol inhaler as needed (she only uses it once every couple of      months).   OBJECTIVE:   VITAL SIGNS:  Height 4 feet 11 inches, weight 97, BP 108/70,  pulse 84 and regular, temperature is 97 degrees.  GENERAL:  She appears to be quite healthy.  She is very petite but does  not look abnormally thin to me.  SKIN:  Clear.  EYES:  Clear. Sclerae clear.  THROAT:  Oropharynx clear.  NECK:  Supple without lymphadenopathy or masses.  LUNGS:  Clear.  CARDIAC:  Rate and rhythm regular without gallops, murmurs, rubs.  Distal pulses full.  ABDOMEN:  Soft, normal bowel sounds, nontender, no masses.  EXTREMITIES:  No clubbing, cyanosis, or edema.  NEUROLOGIC:  Grossly intact.  Her affect is bright and she seems quite  relaxed.   She was here for fasting laboratories on June 18th, these were all  within normal limits including a TSH of 1.45.   ASSESSMENT/PLAN:  1. Complete physical exam.  In general she seems to be doing well.  I      did encourage her to get regular exercise.  2. The patient is concerned about recent weight fluctuations and is      asking for a second opinion from an endocrinologist, therefore, we      will refer her to endocrinology soon.     Tera Mater. Clent Ridges, MD  Electronically Signed    SAF/MedQ  DD: 07/15/2006  DT: 07/17/2006  Job #: [id number]

## 2010-07-03 ENCOUNTER — Encounter: Payer: Self-pay | Admitting: Family Medicine

## 2010-07-03 ENCOUNTER — Ambulatory Visit (INDEPENDENT_AMBULATORY_CARE_PROVIDER_SITE_OTHER): Payer: BC Managed Care – PPO | Admitting: Family Medicine

## 2010-07-03 VITALS — BP 110/78 | Temp 98.7°F | Wt 92.5 lb

## 2010-07-03 DIAGNOSIS — IMO0001 Reserved for inherently not codable concepts without codable children: Secondary | ICD-10-CM

## 2010-07-03 DIAGNOSIS — M791 Myalgia, unspecified site: Secondary | ICD-10-CM

## 2010-07-03 LAB — BASIC METABOLIC PANEL
BUN: 10 mg/dL (ref 6–23)
Calcium: 9.1 mg/dL (ref 8.4–10.5)
Creatinine, Ser: 0.5 mg/dL (ref 0.4–1.2)
GFR: 151.8 mL/min (ref >60.00)

## 2010-07-03 LAB — POCT URINE PREGNANCY: Preg Test, Ur: NEGATIVE

## 2010-07-03 NOTE — Progress Notes (Signed)
  Subjective:    Patient ID: [name], female    DOB: October 20, 1975, 35 y.o.   MRN: [id number]  HPI Here to follow up on diffuse intermittent arm and leg pains. This all started in early May when she had the onset of generalized weakness, body aches, numbness, and a lot of other symptoms. She was seen in the ER and then here, and she had a large number of lab tests, all of which were normal except for some low potassium. She then saw Dr. Dorene Sorrow for a neurologic workup. She has had a brain MRI and NCT/EMG of the extremities, all of which are normal. Dr. Terrace Arabia felt this is not a neurologic issue and told her to follow up with [place].She still describes shooting pains up and down her arms and legs which come and go.   Review of Systems  Constitutional: Negative.   Respiratory: Negative.   Cardiovascular: Negative.   Neurological: Negative.        Objective:   Physical Exam  Constitutional: She appears well-developed and well-nourished.  Neck: No thyromegaly present.  Cardiovascular: Normal rate, regular rhythm, normal heart sounds and intact distal pulses.   Pulmonary/Chest: Effort normal and breath sounds normal.  Musculoskeletal: Normal range of motion. She exhibits no edema and no tenderness.  Lymphadenopathy:    She has no cervical adenopathy.  Neurological: She is alert. She has normal reflexes. No cranial nerve deficit. She exhibits normal muscle tone. Coordination normal.  Psychiatric: She has a normal mood and affect. Her behavior is normal. Judgment and thought content normal.          Assessment & Plan:  Myalgias of uncertain etiology. We will get labs to day to look for possible infectious origins.

## 2010-07-06 LAB — CMV IGM: CMV IgM: 0.08 (ref <0.90)

## 2010-07-06 LAB — CYTOMEGALOVIRUS ANTIBODY, IGG: Cytomegalovirus(CMV) Antibody, IgG: POSITIVE — AB

## 2010-07-06 LAB — B. BURGDORFI ANTIBODIES: B burgdorferi Ab IgG+IgM: 0.31 {ISR}

## 2010-07-06 LAB — EPSTEIN-BARR VIRUS VCA, IGM: EBV VCA IgM: 0.15 {ISR}

## 2010-07-06 LAB — EPSTEIN-BARR VIRUS VCA, IGG: EBV VCA IgG: 3.58 {ISR} — ABNORMAL HIGH

## 2010-07-09 ENCOUNTER — Telehealth: Payer: Self-pay | Admitting: *Deleted

## 2010-07-09 NOTE — Telephone Encounter (Signed)
Left message to call back  

## 2010-07-09 NOTE — Telephone Encounter (Signed)
Message copied by Romualdo Bolk on Thu Jul 09, 2010 11:38 AM ------      Message from: Gershon Crane A      Created: Tue Jul 07, 2010 12:02 PM       Her metabolic panel is normal. She is negative for Lyme disease. 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 I think that when this all started and she was extremely fatigued that she had acute mononucleosis. Now that the acute phase is over, she still has some lasting effects of the virus such as the neuropathic pains in the arms and legs. There is no treatment for mono, and the symptoms generally resolve over a period of weeks to months. She can follow up with me in one month

## 2010-07-09 NOTE — Telephone Encounter (Signed)
Spoke with pt and gave results, also made a copy and ready for pick up.

## 2010-07-21 ENCOUNTER — Ambulatory Visit (INDEPENDENT_AMBULATORY_CARE_PROVIDER_SITE_OTHER): Payer: BC Managed Care – PPO | Admitting: Family Medicine

## 2010-07-21 ENCOUNTER — Encounter: Payer: Self-pay | Admitting: Family Medicine

## 2010-07-21 VITALS — BP 100/64 | HR 114 | Temp 98.5°F | Wt 93.0 lb

## 2010-07-21 DIAGNOSIS — M79609 Pain in unspecified limb: Secondary | ICD-10-CM

## 2010-07-21 DIAGNOSIS — M79606 Pain in leg, unspecified: Secondary | ICD-10-CM

## 2010-07-21 NOTE — Progress Notes (Signed)
  Subjective:    Patient ID: [name], female    DOB: 1975-01-07, 35 y.o.   MRN: [id number]  HPI Here to follow up periodic pain or numbness in the right leg and diffuse bruising on the right leg. This started several months ago, and she has had extensive evaluations since then. She saw Neurology, and they felt this was not a neurologic issue. The only positive lab tests so far has been a positive IgG for EBV. She is still having cycles of these leg symptoms on a monthly basis. They last about 7-10 days and then go away for several weeks. She is quite frustrated as to why they are happening.    Review of Systems  Constitutional: Negative.   Respiratory: Negative.   Cardiovascular: Negative.   Musculoskeletal: Positive for myalgias.  Skin: Positive for color change.  Neurological: Positive for numbness.       Objective:   Physical Exam  Constitutional: She appears well-developed and well-nourished.  Cardiovascular: Normal rate, regular rhythm, normal heart sounds and intact distal pulses.   Pulmonary/Chest: Effort normal and breath sounds normal.  Musculoskeletal: Normal range of motion. She exhibits no edema and no tenderness.  Skin: Skin is warm and dry. No rash noted. No erythema. No pallor.          Assessment & Plan:  There is no obvious involvement today. This may represent some sort of vasculitis, so we will refer her to Rheumatology to evaluate.

## 2010-11-25 ENCOUNTER — Ambulatory Visit (INDEPENDENT_AMBULATORY_CARE_PROVIDER_SITE_OTHER): Payer: 59 | Admitting: Family Medicine

## 2010-11-25 ENCOUNTER — Encounter: Payer: Self-pay | Admitting: Family Medicine

## 2010-11-25 VITALS — BP 112/74 | HR 139 | Temp 98.8°F | Wt 94.0 lb

## 2010-11-25 DIAGNOSIS — J329 Chronic sinusitis, unspecified: Secondary | ICD-10-CM

## 2010-11-25 MED ORDER — AZITHROMYCIN 250 MG PO TABS: ORAL_TABLET | ORAL | Status: AC

## 2010-11-25 NOTE — Progress Notes (Signed)
  Subjective:    Patient ID: [name], female    DOB: Dec 09, 1975, 35 y.o.   MRN: [id number]  HPI Here for one week of sinus pressure, HA, PND, and SDT. No cough.    Review of Systems  Constitutional: Negative.   HENT: Positive for congestion, postnasal drip and sinus pressure.   Eyes: Negative.   Respiratory: Negative.        Objective:   Physical Exam  Constitutional: She appears well-developed and well-nourished.  HENT:  Right Ear: External ear normal.  Left Ear: External ear normal.  Nose: Nose normal.  Mouth/Throat: Oropharynx is clear and moist. No oropharyngeal exudate.  Eyes: Conjunctivae are normal. Pupils are equal, round, and reactive to light.  Neck: No thyromegaly present.  Pulmonary/Chest: Effort normal and breath sounds normal.  Lymphadenopathy:    She has no cervical adenopathy.          Assessment & Plan:  Add Mucinex

## 2011-02-19 ENCOUNTER — Encounter: Payer: Self-pay | Admitting: Family Medicine

## 2011-02-19 ENCOUNTER — Ambulatory Visit (INDEPENDENT_AMBULATORY_CARE_PROVIDER_SITE_OTHER): Payer: 59 | Admitting: Family Medicine

## 2011-02-19 VITALS — BP 106/70 | HR 105 | Temp 98.7°F | Wt 92.0 lb

## 2011-02-19 DIAGNOSIS — J329 Chronic sinusitis, unspecified: Secondary | ICD-10-CM

## 2011-02-19 MED ORDER — AZITHROMYCIN 250 MG PO TABS: ORAL_TABLET | ORAL | Status: AC

## 2011-02-19 MED ORDER — METHYLPREDNISOLONE ACETATE 80 MG/ML IJ SUSP
120.0000 mg | Freq: Once | INTRAMUSCULAR | Status: AC
  Administered 2011-02-19: 120 mg via INTRAMUSCULAR

## 2011-02-19 NOTE — Progress Notes (Signed)
  Subjective:    Patient ID: [name], female    DOB: 11/20/1975, 36 y.o.   MRN: [id number]  HPI Here for one week of sinus pressure, PND, HA, ST, and a dry cough.   Review of Systems  Constitutional: Negative.   HENT: Positive for congestion, postnasal drip and sinus pressure.   Eyes: Negative.   Respiratory: Positive for cough.        Objective:   Physical Exam  Constitutional: She appears well-developed and well-nourished.  HENT:  Right Ear: External ear normal.  Left Ear: External ear normal.  Nose: Nose normal.  Mouth/Throat: Oropharynx is clear and moist. No oropharyngeal exudate.  Eyes: Conjunctivae are normal.  Pulmonary/Chest: Effort normal and breath sounds normal.  Lymphadenopathy:    She has no cervical adenopathy.          Assessment & Plan:  Recheck prn

## 2011-02-19 NOTE — Progress Notes (Signed)
Addended by: Aniceto Boss A on: 02/19/2011 03:19 PM   Modules accepted: Orders

## 2011-09-13 ENCOUNTER — Telehealth: Payer: Self-pay | Admitting: Family Medicine

## 2011-09-13 NOTE — Telephone Encounter (Signed)
Caller: [name]/Patient; Patient Name: [name]; PCP: Gershon Crane [facility]); Best Callback Phone Number: [phone]; Reason for call: states that she was seen in the office on Friday, 09/10/11 for Sinus symptoms. Was placed on Xopenex as a rescue inhlaer, Pataday eye drops and given a sample of Patanase. States that she had a lowe Potassium level last year/2012. Read side effects of Xopenex and got scared when she read that Xopenex can cause low Potassium levels. States that level last year was 3.2. Has felt "over heated at times". States that it says on the medication pamphlet to notify your doctor of any previous low Potassium levels before taking medication.  "I never get hot." Is not currently having any symptoms at time of call. Afebrile. Made an appointment with office, then once patient told office staff about symptoms, office staff cancelled appointment telling patient that she needed to talk to a nurse and may need a sooner appointment. Made patient appointment for 09/14/11 at 1115AM with Dr. Clent Ridges.

## 2011-09-14 ENCOUNTER — Encounter: Payer: Self-pay | Admitting: Family Medicine

## 2011-09-14 ENCOUNTER — Ambulatory Visit (INDEPENDENT_AMBULATORY_CARE_PROVIDER_SITE_OTHER): Payer: 59 | Admitting: Family Medicine

## 2011-09-14 VITALS — BP 112/64 | HR 113 | Temp 98.5°F | Wt 95.0 lb

## 2011-09-14 DIAGNOSIS — R002 Palpitations: Secondary | ICD-10-CM

## 2011-09-14 DIAGNOSIS — J45909 Unspecified asthma, uncomplicated: Secondary | ICD-10-CM

## 2011-09-14 LAB — BASIC METABOLIC PANEL
CO2: 27 mEq/L (ref 19–32)
Calcium: 9.4 mg/dL (ref 8.4–10.5)
Creatinine, Ser: 0.6 mg/dL (ref 0.4–1.2)
GFR: 112.83 mL/min (ref >60.00)
Sodium: 138 mEq/L (ref 135–145)

## 2011-09-14 LAB — CBC WITH DIFFERENTIAL/PLATELET
Basophils Relative: 0.5 % (ref 0.0–3.0)
Eosinophils Relative: 3.2 % (ref 0.0–5.0)
Hemoglobin: 14.3 g/dL (ref 12.0–15.0)
Lymphocytes Relative: 25.6 % (ref 12.0–46.0)
MCHC: 33.2 g/dL (ref 30.0–36.0)
MCV: 94 fl (ref 78.0–100.0)
Monocytes Absolute: 0.6 10*3/uL (ref 0.1–1.0)
Neutro Abs: 3.7 10*3/uL (ref 1.4–7.7)
Neutrophils Relative %: 61.5 % (ref 43.0–77.0)
RBC: 4.59 Mil/uL (ref 3.87–5.11)
WBC: 6.1 10*3/uL (ref 4.5–10.5)

## 2011-09-14 LAB — HEPATIC FUNCTION PANEL
Alkaline Phosphatase: 47 U/L (ref 39–117)
Bilirubin, Direct: 0.2 mg/dL (ref 0.0–0.3)
Total Bilirubin: 1.1 mg/dL (ref 0.3–1.2)
Total Protein: 7.3 g/dL (ref 6.0–8.3)

## 2011-09-14 NOTE — Progress Notes (Signed)
  Subjective:    Patient ID: [name], female    DOB: [birth year], 36 y.o.   MRN: [id number]  HPI Here for advice about palpitations. She saw Dr. Irena Cords recently about her allergies and asthma, and they discussed her intolerance to Proair as a rescue inhaler. This had caused her to feel very anxious and shaky, and it made her heart race. Dr. Irena Cords gave her some Xopenex to try instead, and she did use this once last week. However this caused the same reactions as albuterol did. She read in the product information sheet that people with low potassiums are prone to this reaction, and she wants to have this checked again. Today she feels fine.    Review of Systems  Constitutional: Negative.   HENT: Negative.   Respiratory: Positive for shortness of breath. Negative for choking, chest tightness and wheezing.   Cardiovascular: Positive for palpitations. Negative for chest pain and leg swelling.       Objective:   Physical Exam  Constitutional: She appears well-developed and well-nourished.  Neck: No thyromegaly present.  Cardiovascular: Regular rhythm, normal heart sounds and intact distal pulses.  Exam reveals no gallop and no friction rub.   No murmur heard.      Rapid rate   Pulmonary/Chest: Effort normal and breath sounds normal. No respiratory distress. She has no wheezes. She has no rales.  Lymphadenopathy:    She has no cervical adenopathy.          Assessment & Plan:  I think she simply has an intolerance to beta agonists, but we will get labs today to investigate further

## 2011-09-15 ENCOUNTER — Ambulatory Visit: Payer: 59 | Admitting: Family Medicine

## 2011-09-17 NOTE — Progress Notes (Signed)
Quick Note:  Called and spoke with pt and pt is aware. ______ 

## 2011-11-16 ENCOUNTER — Encounter: Payer: Self-pay | Admitting: Family Medicine

## 2011-11-16 ENCOUNTER — Ambulatory Visit (INDEPENDENT_AMBULATORY_CARE_PROVIDER_SITE_OTHER): Payer: 59 | Admitting: Family Medicine

## 2011-11-16 VITALS — BP 110/66 | HR 105 | Temp 98.7°F | Wt 96.0 lb

## 2011-11-16 DIAGNOSIS — R51 Headache: Secondary | ICD-10-CM

## 2011-11-16 MED ORDER — SUMATRIPTAN SUCCINATE 100 MG PO TABS: 100.0000 mg | ORAL_TABLET | ORAL | Status: DC | PRN

## 2011-11-16 MED ORDER — METHYLPREDNISOLONE ACETATE 80 MG/ML IJ SUSP
80.0000 mg | Freq: Once | INTRAMUSCULAR | Status: AC
  Administered 2011-11-16: 80 mg via INTRAMUSCULAR

## 2011-11-16 MED ORDER — KETOROLAC TROMETHAMINE 60 MG/2ML IM SOLN
60.0000 mg | Freq: Once | INTRAMUSCULAR | Status: AC
  Administered 2011-11-16: 60 mg via INTRAMUSCULAR

## 2011-11-16 NOTE — Progress Notes (Signed)
  Subjective:    Patient ID: [name], female    DOB: 20-Oct-1975, 36 y.o.   MRN: [id number]  HPI Here for 4 days of a dull generalized HA with some spells of lightheadedness and nausea without vomiting. No fever. No other neurologic deficits. No blurred vision. She has a hx of migraines but has not had one like this for several years. She started her menses the same day this started. Using Excedrin Migraine.  Review of Systems  Constitutional: Negative.   Eyes: Negative.   Respiratory: Negative.   Cardiovascular: Negative.   Neurological: Positive for light-headedness and headaches. Negative for dizziness, tremors, seizures, syncope, facial asymmetry, speech difficulty, weakness and numbness.       Objective:   Physical Exam  Constitutional: She is oriented to person, place, and time. She appears well-developed and well-nourished.  HENT:  Head: Normocephalic and atraumatic.  Eyes: Conjunctivae normal and EOM are normal. Pupils are equal, round, and reactive to light.  Neck: Neck supple. No thyromegaly present.  Lymphadenopathy:    She has no cervical adenopathy.  Neurological: She is alert and oriented to person, place, and time. No cranial nerve deficit. Coordination normal.          Assessment & Plan:  Migraine. Given shots of toradol and depomedrol. Try Imitrex.

## 2011-11-16 NOTE — Addendum Note (Signed)
Addended by: Aniceto Boss A on: 11/16/2011 05:38 PM   Modules accepted: Orders

## 2012-06-09 ENCOUNTER — Other Ambulatory Visit (INDEPENDENT_AMBULATORY_CARE_PROVIDER_SITE_OTHER): Payer: 59

## 2012-06-09 ENCOUNTER — Telehealth: Payer: Self-pay | Admitting: Family Medicine

## 2012-06-09 DIAGNOSIS — R200 Anesthesia of skin: Secondary | ICD-10-CM

## 2012-06-09 DIAGNOSIS — R209 Unspecified disturbances of skin sensation: Secondary | ICD-10-CM

## 2012-06-09 LAB — CBC WITH DIFFERENTIAL/PLATELET
Eosinophils Relative: 3.7 % (ref 0.0–5.0)
Lymphocytes Relative: 35.5 % (ref 12.0–46.0)
MCV: 92.4 fl (ref 78.0–100.0)
Monocytes Absolute: 0.4 10*3/uL (ref 0.1–1.0)
Monocytes Relative: 9.9 % (ref 3.0–12.0)
Neutrophils Relative %: 50.3 % (ref 43.0–77.0)
Platelets: 206 10*3/uL (ref 150.0–400.0)
WBC: 4.4 10*3/uL — ABNORMAL LOW (ref 4.5–10.5)

## 2012-06-09 LAB — BASIC METABOLIC PANEL
Calcium: 9 mg/dL (ref 8.4–10.5)
Creatinine, Ser: 0.5 mg/dL (ref 0.4–1.2)
GFR: 134.26 mL/min (ref >60.00)
Sodium: 135 mEq/L (ref 135–145)

## 2012-06-09 LAB — TSH: TSH: 2.2 u[IU]/mL (ref 0.35–5.50)

## 2012-06-09 LAB — HEPATIC FUNCTION PANEL
AST: 27 U/L (ref 0–37)
Bilirubin, Direct: 0.2 mg/dL (ref 0.0–0.3)
Total Bilirubin: 2.3 mg/dL — ABNORMAL HIGH (ref 0.3–1.2)

## 2012-06-09 LAB — VITAMIN B12: Vitamin B-12: 812 pg/mL (ref 211–911)

## 2012-06-09 NOTE — Telephone Encounter (Signed)
I spoke with pt and the lab order was placed in computer, except for the hormone panel. We do not order those here.

## 2012-06-09 NOTE — Telephone Encounter (Signed)
I put in lab orders. She just needs to set up a lab appt

## 2012-06-09 NOTE — Telephone Encounter (Signed)
Patient Information:  Caller Name: Fiorela  Phone: [phone]  Patient: [name], [name]  Gender: Female  DOB: 08/20/1975  Age: 37 Years  PCP: Gershon Crane [facility])  Pregnant: No  Office Follow Up:  Does the office need to follow up with this patient?: Yes  Instructions For The Office: PLS READ RN NOTE AND CALL BACK FOR APPT NEEDS, DR Clent Ridges PT.  RN Note:  Pt took Imitrex for Mirgraine on 6-3, Pt has not been able to sleep since taking med.  Pt states she only took 1 dose of Imitrex. Pt had numbness in Right leg, onset 7pm on 6-5 and woke at 130am on 6-6 w/ numbness in Leg Arm. Pt denies Migraine or Numbness at time of call. Pt states she had same symptoms 3 years ago, seen at ED, TIA and Blood Clot ruled out, only diagnosis was low potassium per Pt. Pt wants to have blood work done to check TSH, Hormones and K levels.  No same day appts w/ Dr Clent Ridges.   Symptoms  Reason For Call & Symptoms: ER CALL. Insomnia, Numbness in Right Leg, Left Arm  Reviewed Health History In EMR: N/A  Reviewed Medications In EMR: N/A  Reviewed Allergies In EMR: N/A  Reviewed Surgeries / Procedures: N/A  Date of Onset of Symptoms: 06/06/2012  Treatments Tried: imitrex  Treatments Tried Worked: No OB / GYN:  LMP: 05/09/2012  Guideline(s) Used:  Insomnia  Disposition Per Guideline:   See Within 3 Days in Office  Reason For Disposition Reached:   Insomnia interferes with work or school  Advice Given:  N/A  Patient Will Follow Care Advice:  YES

## 2012-06-12 MED ORDER — POTASSIUM CHLORIDE ER 10 MEQ PO TBCR: 10.0000 meq | EXTENDED_RELEASE_TABLET | Freq: Every day | ORAL | Status: DC

## 2012-06-12 NOTE — Progress Notes (Signed)
Quick Note:  I spoke with pt and send script e-scribe ______ 

## 2012-06-16 ENCOUNTER — Other Ambulatory Visit: Payer: 59

## 2012-06-20 ENCOUNTER — Telehealth: Payer: Self-pay | Admitting: Family Medicine

## 2012-06-20 NOTE — Telephone Encounter (Signed)
Patient Information:  Caller Name: Kimia  Phone: [phone]  Patient: [name], [name]  Gender: Female  DOB: 1975/06/12  Age: 37 Years  PCP: Gershon Crane [facility])  Pregnant: No  Office Follow Up:  Does the office need to follow up with this patient?: No  Instructions For The Office: N/A  RN Note:  c/o left arm pain and numbness.  States has recently started potassium supplement for low potassium level, which improved after starting KClor 10 days ago, but has returned 06/20/12.  States she wonders if her minocycline is affecting her potassium, or if Allegra D 12-hour is affecting it as well.  Wants to know if the arm pain "is normal for the low potassium," or if she should be concerned.  Also recently started imitrex for migraine.  Emergent symptoms denied per arm pain protocol; advised appt within 24 hours.  Appt scheduled 06/21/12 0845 with Dr. Clent Ridges.  krs/can  Symptoms  Reason For Call & Symptoms: pain and numbness in left arm; onset 1300 06/20/12.  Taking potassium x 10 days.  Also taking minocycline 100mg  qd until 09/14.  States she is now taking it every other day.  Reviewed Health History In EMR: Yes  Reviewed Medications In EMR: Yes  Reviewed Allergies In EMR: Yes  Reviewed Surgeries / Procedures: Yes  Date of Onset of Symptoms: Unknown OB / GYN:  LMP: Unknown  Guideline(s) Used:  Arm Pain  Disposition Per Guideline:   See Today or Tomorrow in Office  Reason For Disposition Reached:   Numbness (i.e., loss of sensation) in hand or fingers  Advice Given:  N/A  Patient Will Follow Care Advice:  YES  Appointment Scheduled:  06/21/2012 08:45:00 Appointment Scheduled Provider:  Gershon Crane (Family Practice)

## 2012-06-21 ENCOUNTER — Ambulatory Visit (INDEPENDENT_AMBULATORY_CARE_PROVIDER_SITE_OTHER): Payer: 59 | Admitting: Family Medicine

## 2012-06-21 ENCOUNTER — Encounter: Payer: Self-pay | Admitting: Family Medicine

## 2012-06-21 ENCOUNTER — Encounter: Payer: 59 | Admitting: Family Medicine

## 2012-06-21 VITALS — BP 138/90 | HR 80 | Temp 98.3°F | Wt 97.0 lb

## 2012-06-21 DIAGNOSIS — R202 Paresthesia of skin: Secondary | ICD-10-CM

## 2012-06-21 DIAGNOSIS — R209 Unspecified disturbances of skin sensation: Secondary | ICD-10-CM

## 2012-06-21 NOTE — Progress Notes (Signed)
  Subjective:    Patient ID: [name], female    DOB: [birth year], 37 y.o.   MRN: [id number]  HPI Here to discuss more sensations she has in her body. She had some labs drawn a few weeks ago which showed a low potassium , and he was started on a supplement. However she still experiences tingling or numbness all over her body that comes and goes. She has had numbness or tingling or mild pains in the left arm and hand lately. Of note she was worked up for similar symptoms in 2012 by [facility], and nothing was found. She has normal nerve conduction studies at that time. She has had odd cravings for foods lately including vanilla cake frosting and tomatoes.    Review of Systems  Constitutional: Negative.   Neurological: Positive for numbness. Negative for dizziness, tremors, seizures, syncope, speech difficulty, weakness, light-headedness and headaches.       Objective:   Physical Exam  Constitutional: She is oriented to person, place, and time. She appears well-developed and well-nourished.  Cardiovascular: Normal rate, regular rhythm, normal heart sounds and intact distal pulses.   Pulmonary/Chest: Effort normal and breath sounds normal.  Musculoskeletal: Normal range of motion. She exhibits no edema and no tenderness.  Neurological: She is alert and oriented to person, place, and time. She has normal reflexes. No cranial nerve deficit. She exhibits normal muscle tone. Coordination normal.          Assessment & Plan:  It is not clear what her symptoms may mean. We will recheck a BMET in the next few weeks. Refer to Nutrition to evaluate her diet.

## 2012-06-29 ENCOUNTER — Encounter: Payer: Self-pay | Admitting: Dietician

## 2012-06-29 ENCOUNTER — Encounter: Payer: 59 | Attending: Family Medicine | Admitting: Dietician

## 2012-06-29 VITALS — Ht 60.5 in | Wt 96.6 lb

## 2012-06-29 DIAGNOSIS — Z724 Inappropriate diet and eating habits: Secondary | ICD-10-CM | POA: Insufficient documentation

## 2012-06-29 DIAGNOSIS — G4700 Insomnia, unspecified: Secondary | ICD-10-CM

## 2012-06-29 DIAGNOSIS — Z713 Dietary counseling and surveillance: Secondary | ICD-10-CM | POA: Insufficient documentation

## 2012-06-29 DIAGNOSIS — Z862 Personal history of diseases of the blood and blood-forming organs and certain disorders involving the immune mechanism: Secondary | ICD-10-CM

## 2012-06-29 DIAGNOSIS — R51 Headache: Secondary | ICD-10-CM

## 2012-06-29 NOTE — Progress Notes (Signed)
Assessment: Pt c/o painful parasthesia at varying times. Primary areas are left arm and right leg. Issues began 2010-05-24. Pt c/o dead spots among almost all left arm and right leg c weakness in the wrist, hands, feet, and general gait disturbance and weakness in leg during parasthesia presence (seems like a temporary sensory ataxia?). Pt also c/o insomnia for temporary stretches.  Nutrition focused physical examination reveals pertinent negatives: Glossitis, cheilosis, stomatitis, gingivitis, nasolabial seborrhea, jaundice, dry scalp, thin or cracking hair. Possible hyperpigmentation in lower leg (unclear if this is the skin infection noted below), good skin turgor, no identifiable muscle atrophy or wasting. Pt has no c/o diarrhea, steatorrhea, anorexia, emesis, pica, alterations to visual acuity.  Pt diagnosed with asthma at age 37. Rarely feels need to use albuterol inhaler (<once per 6 months). Dx made after c/o costochondritis. Most recent flare up of this occurred 2012 Feb-May. Pt was in [place] christmas 2011.  Recurrent infections, particularly in sinus since January of 2012. Pt also notes very profound skin reaction in late December 2013/January of 2014, noted as bacterial in nature (unsure whether food related). Still taking minocyclin qd for this, as it still may be present on lower leg. Pt also notes increase in allergy-type symptoms since 2012.   Meds- Minocyclin qd  Diet recall: B: bowl of oatmeal (cooked in water, then add milk 1 or 2%) with banana and nuts. Or bowl of raisin bran. Sometimes large brunch of eggs, french toast, pancakes, etc. Coffee in AM (coffee creamer unflavored with whipped cream, no sugar).  Snack: none, water L: leftovers or sandwich (lunchmeat, cheese, lettuce, tomato, miracle whip) or yogurt (if no set lunch break) Snack: only if no lunch break (PB sandwich); sometimes a protein shake (premier protein or optimum nutrition performance whey) D: basa filet  with white rice and fresh asparagus; usually protein, starch, veg, occasional meatless nights, occasional skip starch for a chix on salad. Sometimes veggie burgers with fries.  HS Snack: oreos and milk or ice cream most nights Beverages: Coffee 2 cups per day (at most 3 usually), water, few days a week mixes emergen-C into water, occasional regular soda, EtOH only about 3 times per year, OJ once per week, milk with dessert or cereal, [employer] water rarely  Physical Activity: lots of gardening (about 15-20 minutes per day average), none otherwise noted   Nutritional Diagnosis:  [place]ford-1.4 Altered GI function As related to recurrent use of antibiotics, likely lack of positive bowel bacteria.  As evidenced by pt diet limited in pre and probiotic foods, recurrent infections and need for recurrent antibiotic use, multiple symptoms (parasthesia, weakness, numbness, ataxia) of varying vitamin B deficiencies, primary sources of these vitamins coming from production via colon bacteria.  Intervention: RD discussed incorporation of pro and prebiotic food items, limitation or replacement of foods that tend to promote negative bacterial overgrowth (animal based saturated fats, heavy metals, organic pollutants/pesticides, concentrated sugar intake, especially large doses of fructose, sugar alcohols), and physical activity guidelines to promote better gut health. RD also advised increasing intake of multiple vitamins (Thiamin, Niacin, Pantothenic Acid, B6, and B12, Vit E) and Copper as parasthesia or similar symptoms have been associated with deficiency of all of these. However, since gut microbiota are so influential in absorption of most of these vitamins, improving gut health is more likely the root cause of any of these deficiencies. Poor gut health (in terms of microbiome) is also linked to increasing allergies, and inflammatory diseases/symptoms, and thus should be  addressed with the pt's  symptoms.  Recommendations are noted below- Kefir at least 8 oz. per day, preferably low fat, especially good if it contains inulin (look for Helios brand).  No proteins from [place], or [place]. Limit rice from [place]. Increase fruit, vegetable, and general high fiber food intake. Make sure to have a high fiber and high protein food at every meal. Limit fats from animal sources, especially creams, poultry skin, and fatty meats. Limit simple sugars wherever possible, especially processed foods with large amounts of fructose syrup. Switch dessert foods to plant fat bases- try coconut ice cream as a replacement. Choose cage free eggs and grass fed beef or bison when possible. Choose grass fed dairy when possible [employer] is easily available). Choose Vitamin/Mineral supplement with approximately 100% RDA of Thiamin, Niacin, Pantothenic Acid, B6 and B12.  If you choose a protein supplement, choose one that is known to have a low dose of arsenic, lead, and other heavy metals (EAS Myoplex and Muscle Milk are popular brands known to have high doses of some of these). Choose more high Copper and Vitamin E foods- whole grains, dark leafy greens, nuts/seeds, and cocoa. Choose foods with oligosaccharides- most vegetables and high fiber foods (especially legumes) contain these. Onions, leeks, chicory, asparagus, and artichoke are good sources in particular. Physical activity- try for 30 minutes most days. Your goal is to exert to the point you can only speak 7 to 10 words before requiring a large breath.  Unfortunately, because the pt is still taking an antibiotic, advised to continue until September, true efficacy of this medical nutrition therapy will be difficult to determine in short term. More drastic changes may be contemplated, such as complete elimination of caffeine, cow's milk, juice, soda, etc. or attempts at an elimination diet could be made to rule out allergic reactions to a food or  intolerances.   Monitoring/Evaluation: Monitor pt adherence to diet, recurrence of infection, parasthesia, numbness, and/or allergic symptoms. F/U in 3 weeks to consider alterations to plan of care in short term, advise pt on difficulties with new dietary changes.

## 2012-07-03 ENCOUNTER — Encounter: Payer: 59 | Admitting: Family Medicine

## 2012-07-10 ENCOUNTER — Encounter: Payer: 59 | Admitting: Family Medicine

## 2012-07-12 ENCOUNTER — Telehealth: Payer: Self-pay | Admitting: Family Medicine

## 2012-07-12 ENCOUNTER — Encounter: Payer: Self-pay | Admitting: Internal Medicine

## 2012-07-12 ENCOUNTER — Ambulatory Visit (INDEPENDENT_AMBULATORY_CARE_PROVIDER_SITE_OTHER): Payer: 59 | Admitting: Internal Medicine

## 2012-07-12 ENCOUNTER — Encounter: Payer: Self-pay | Admitting: Family Medicine

## 2012-07-12 VITALS — BP 118/80 | HR 117 | Temp 98.5°F | Resp 20 | Wt 98.0 lb

## 2012-07-12 DIAGNOSIS — R209 Unspecified disturbances of skin sensation: Secondary | ICD-10-CM

## 2012-07-12 DIAGNOSIS — M79602 Pain in left arm: Secondary | ICD-10-CM

## 2012-07-12 DIAGNOSIS — M79609 Pain in unspecified limb: Secondary | ICD-10-CM

## 2012-07-12 DIAGNOSIS — R202 Paresthesia of skin: Secondary | ICD-10-CM

## 2012-07-12 NOTE — Progress Notes (Signed)
Subjective:    Patient ID: [name], female    DOB: 23-Dec-1975, 37 y.o.   MRN: [id number]  HPI  37 year old patient who presents today complaining of some left upper arm discomfort as well as left arm paresthesias. 2 years ago she was evaluated for similar symptoms included a brain MRI neurological evaluation and nerve conduction studies. Last month she had recurrent symptoms with numbness and weakness the involving her right leg she states it for a few hours she had a difficult time walking and required assistance. At the present time she has a pressure sensation in the left upper arm she describes her hands being cold with episodes of tingling paresthesias. These symptoms started yesterday. She had a recent laboratory screen with mild hypokalemia. She is now on potassium supplements as well as multivitamin and other mineral supplements. She has a scheduled followup with her PCP soon  Past Medical History  Diagnosis Date  . Allergy   . Chronic headaches   . Hypoglycemia   . Asthma     sees Dr. Irena Cords     History   Social History  . Marital Status: Married    Spouse Name: N/A    Number of Children: N/A  . Years of Education: N/A   Occupational History  . Not on file.   Social History Main Topics  . Smoking status: Never Smoker   . Smokeless tobacco: Never Used  . Alcohol Use: No  . Drug Use: No  . Sexually Active: Not on file   Other Topics Concern  . Not on file   Social History Narrative  . No narrative on file    Past Surgical History  Procedure Laterality Date  . Appendectomy      Family History  Problem Relation Age of Onset  . Diabetes Father   . Heart disease Father   . Hypertension Father     Allergies  Allergen Reactions  . Cefdinir     REACTION: diarrhea  . Cefuroxime Axetil     REACTION: diarrhea  . Codeine     Current Outpatient Prescriptions on File Prior to Visit  Medication Sig Dispense Refill  . Calcium Carbonate-Vitamin D  (CALCIUM-VITAMIN D) 500-200 MG-UNIT per tablet Take 1 tablet by mouth daily.        . fexofenadine-pseudoephedrine (ALLEGRA-D) 60-120 MG per tablet Take 1 tablet by mouth daily. 12 hour      . hydrocortisone 2.5 % cream Apply 1 application topically as needed.      . levalbuterol (XOPENEX HFA) 45 MCG/ACT inhaler Inhale 1-2 puffs into the lungs every 4 (four) hours as needed for wheezing.      . minocycline (MINOCIN,DYNACIN) 100 MG capsule Take 100 mg by mouth every other day.      . Multiple Vitamins-Minerals (MULTIVITAMIN,TX-MINERALS) tablet Take 1 tablet by mouth daily.        . Olopatadine HCl (PATANASE NA) Place 2 sprays into the nose 2 (two) times daily.      . potassium chloride (KLOR-CON 10) 10 MEQ tablet Take 1 tablet (10 mEq total) by mouth daily.  30 tablet  11  . SUMAtriptan (IMITREX) 100 MG tablet Take 1 tablet (100 mg total) by mouth as needed for migraine.  10 tablet  11   No current facility-administered medications on file prior to visit.    BP 118/80  Pulse 117  Temp(Src) 98.5 F (36.9 C) (Oral)  Resp 20  Wt 98 lb (44.453 kg)  BMI 18.82 kg/m2  SpO2 95%  LMP 07/07/2012       Review of Systems  Neurological: Positive for weakness and numbness.       Objective:   Physical Exam  Constitutional: She appears well-developed and well-nourished. No distress.  Musculoskeletal: Normal range of motion. She exhibits no edema.  Mild tenderness to gentle compression over the left upper arm area Reflexes were symmetrical Mild diminished left grip strength in this right-handed individual; Inconsistent effort with left arm extension but probably no muscle weakness  Able to walk on her toes and heels without difficulty No drift of the outstretched arms Normal finger to nose testing          Assessment & Plan:   Left upper arm pain and left arm paresthesias. Unremarkable clinical exam  We'll continue present plan was supplements vitamins. She is scheduled to see  her PCP later this month. We'll consider further testing if still symptomatic or new symptoms develop. She will call if there is any clinical worsening

## 2012-07-12 NOTE — Telephone Encounter (Signed)
Patient Information:  Caller Name: [name]  Phone: [phone]  Patient: [name], [name]  Gender: Female  DOB: 1975/06/04  Age: 37 Years  PCP: Gershon Crane [facility])  Pregnant: No  Office Follow Up:  Does the office need to follow up with this patient?: No  Instructions For The Office: N/A  RN Note:  Current left arm pain rated 6-7/10. Arm pain was severe, rated "20/10", on  07/11/12 at 1030. Hand weakness present; reports weak grip. Able to lift items with hand; denies dropping items.  Symptoms  Reason For Call & Symptoms: Recurrent left arm pain from shoulder to hand; pain increased in hand with sensation of electric volts and tingling in fingers.  Reviewed Health History In EMR: Yes  Reviewed Medications In EMR: Yes  Reviewed Allergies In EMR: Yes  Reviewed Surgeries / Procedures: Yes  Date of Onset of Symptoms: 07/10/2012  Treatments Tried: TENS stimulator daily X 15 minutes  Treatments Tried Worked: Yes OB / GYN:  LMP: 07/07/2012  Guideline(s) Used:  Arm Pain  Hand and Wrist Pain  Disposition Per Guideline:   See Today in Office  Reason For Disposition Reached:   Weakness (i.e., loss of strength) of new onset in hand or fingers  Advice Given:  Rest:   You should try to avoid any exercise or activity that caused this pain for the next 3 days.  Pain Medicines:  For pain relief, you can take either acetaminophen, ibuprofen, or naproxen.  They are over-the-counter (OTC) pain drugs. You can buy them at the drugstore.  Ibuprofen (e.g., Motrin, Advil):  Take 400 mg (two 200 mg pills) by mouth every 6 hours.  Another choice is to take 600 mg (three 200 mg pills) by mouth every 8 hours.  The most you should take each day is 1,200 mg (six 200 mg pills), unless your doctor has told you to take more.  Reassurance  : Hand and wrist pain can be caused by strained muscles, tendonitis, and mild arthritis.  Call Back If:  You become worse.  Patient Will Follow Care Advice:  YES  Appointment Scheduled:  07/12/2012 10:15:00 Appointment Scheduled Provider:  Eleonore Chiquito [facility])

## 2012-07-12 NOTE — Patient Instructions (Signed)
Follow up with Dr. Clent Ridges later this month as planned Report immediately any new or worsening symptoms

## 2012-07-17 ENCOUNTER — Other Ambulatory Visit (INDEPENDENT_AMBULATORY_CARE_PROVIDER_SITE_OTHER): Payer: 59

## 2012-07-17 DIAGNOSIS — Z0000 Encounter for general adult medical examination without abnormal findings: Secondary | ICD-10-CM

## 2012-07-17 LAB — CBC WITH DIFFERENTIAL/PLATELET
Basophils Relative: 0.8 % (ref 0.0–3.0)
Eosinophils Relative: 4.3 % (ref 0.0–5.0)
HCT: 40.3 % (ref 36.0–46.0)
Lymphs Abs: 1.1 10*3/uL (ref 0.7–4.0)
MCV: 92.3 fl (ref 78.0–100.0)
Monocytes Absolute: 0.3 10*3/uL (ref 0.1–1.0)
Monocytes Relative: 11.1 % (ref 3.0–12.0)
Neutrophils Relative %: 48.2 % (ref 43.0–77.0)
RBC: 4.37 Mil/uL (ref 3.87–5.11)
WBC: 3.1 10*3/uL — ABNORMAL LOW (ref 4.5–10.5)

## 2012-07-17 LAB — HEPATIC FUNCTION PANEL
Albumin: 4.6 g/dL (ref 3.5–5.2)
Total Protein: 7.3 g/dL (ref 6.0–8.3)

## 2012-07-17 LAB — POCT URINALYSIS DIPSTICK
Ketones, UA: NEGATIVE
Leukocytes, UA: NEGATIVE
Nitrite, UA: NEGATIVE
Protein, UA: NEGATIVE
Urobilinogen, UA: 0.2
pH, UA: 6.5

## 2012-07-17 LAB — LIPID PANEL
Total CHOL/HDL Ratio: 2
VLDL: 9.6 mg/dL (ref 0.0–40.0)

## 2012-07-17 LAB — BASIC METABOLIC PANEL
BUN: 13 mg/dL (ref 6–23)
CO2: 28 mEq/L (ref 19–32)
GFR: 126.07 mL/min (ref >60.00)
Glucose, Bld: 80 mg/dL (ref 70–99)
Potassium: 3.7 mEq/L (ref 3.5–5.1)

## 2012-07-17 LAB — TSH: TSH: 1.55 u[IU]/mL (ref 0.35–5.50)

## 2012-07-24 ENCOUNTER — Encounter: Payer: 59 | Attending: Family Medicine | Admitting: Dietician

## 2012-07-24 DIAGNOSIS — Z862 Personal history of diseases of the blood and blood-forming organs and certain disorders involving the immune mechanism: Secondary | ICD-10-CM

## 2012-07-24 DIAGNOSIS — G4700 Insomnia, unspecified: Secondary | ICD-10-CM

## 2012-07-24 DIAGNOSIS — Z724 Inappropriate diet and eating habits: Secondary | ICD-10-CM | POA: Insufficient documentation

## 2012-07-24 DIAGNOSIS — Z713 Dietary counseling and surveillance: Secondary | ICD-10-CM | POA: Insufficient documentation

## 2012-07-24 NOTE — Progress Notes (Signed)
Quick Note:  I released results in my chart. ______ 

## 2012-07-24 NOTE — Progress Notes (Signed)
A: Pt c/o tachycardia causes her to wake in the middle of the night, and claims some tachycardia during sedentary activities while awake. She also had a very bad bout of parasthesia on July 8-9, and was noted for mild hypokalemia. She was treated with Klor-Con and recalls fewer parasthesia symptoms or tachycardia since. She claims to have drunk water excessively prior to this attack as well. After discussing the true sensations of the parasthesia with the patient, she revealed the pain may be more muscular than previously suspected, with feelings like a charlie horse in her leg turning to loss of motor control and feeling.   Pt also states she is on well water, and has not had it checked in a good while. She is considering having it checked.  She mentioned that she recently discovered her mother had some type of issue absorbing Chloride, but was unable to identify the specifics.  Pt states that since adopting new probiotic diet, parasthesia symptomology has no really reduced, but it has aided her sleep pattern, and Klor-Con has helped especially with her sleep quality. She has had very few issues with adopting the diet.  Dx: History of potassium deficiency, possible Magnesium deficiency as related to parasthesia as evidenced by muscular pain/cramping.  I: Continue plan of care with probiotic diet until pt has been fully weaned off antibiotics, then reevaluate efficacy of that treatment.  RD recommends supplemental Klor-Con 10 mEq continues qd, with additional supplemental Magnesium 250 mg qd.  RD encouraged pt to get well water tested, and high doses of a number of minerals would be of interest, including Arsenic, Lead, Cadmium, Manganese, Iron, Calcium.  RD encouraged pt to attempt more flexibility training, like yoga or pilates (if Bikram yoga, bring a sports drink with Na+, K+, Cl-)  M/E: Monitor symptoms of parasthesia with supplemental minerals, any potential high dose minerals or metals in  well water, efficacy of diet once antibiotics are D/Ced. F/U in 3 months. RD advised pt to call and report any changes in symptoms within the month.

## 2012-07-26 ENCOUNTER — Ambulatory Visit (INDEPENDENT_AMBULATORY_CARE_PROVIDER_SITE_OTHER): Payer: 59 | Admitting: Family Medicine

## 2012-07-26 ENCOUNTER — Encounter: Payer: Self-pay | Admitting: Family Medicine

## 2012-07-26 VITALS — BP 126/78 | HR 116 | Temp 98.6°F | Ht 60.5 in | Wt 96.0 lb

## 2012-07-26 DIAGNOSIS — Z0000 Encounter for general adult medical examination without abnormal findings: Secondary | ICD-10-CM

## 2012-07-26 MED ORDER — POTASSIUM CHLORIDE ER 10 MEQ PO TBCR: 10.0000 meq | EXTENDED_RELEASE_TABLET | Freq: Two times a day (BID) | ORAL | Status: DC

## 2012-07-26 NOTE — Progress Notes (Signed)
  Subjective:    Patient ID: [name], female    DOB: 1975/10/31, 37 y.o.   MRN: [id number]  HPI 37 yr old female for a cpx. She feels well except for the intermittent paresthesias in the left arm. As noted in the chart she had a detailed neurologic workup 2 years ago by Dr. Terrace Arabia, and no etiology was found. This seems to be triggered whenever her potassium gets low, and in fact her potassium was low on 06-09-12 at 3.2. She was started on 10 mEq a day, and her latest level is up to 3.7. She now feels better and the arm only slightly bothers her.    Review of Systems  Constitutional: Negative.   HENT: Negative.   Eyes: Negative.   Respiratory: Negative.   Cardiovascular: Negative.   Gastrointestinal: Negative.   Genitourinary: Negative for dysuria, urgency, frequency, hematuria, flank pain, decreased urine volume, enuresis, difficulty urinating, pelvic pain and dyspareunia.  Musculoskeletal: Negative.   Skin: Negative.   Neurological: Negative.   Psychiatric/Behavioral: Negative.        Objective:   Physical Exam  Constitutional: She is oriented to person, place, and time. She appears well-developed and well-nourished. No distress.  HENT:  Head: Normocephalic and atraumatic.  Right Ear: External ear normal.  Left Ear: External ear normal.  Nose: Nose normal.  Mouth/Throat: Oropharynx is clear and moist. No oropharyngeal exudate.  Eyes: Conjunctivae and EOM are normal. Pupils are equal, round, and reactive to light. No scleral icterus.  Neck: Normal range of motion. Neck supple. No JVD present. No thyromegaly present.  Cardiovascular: Normal rate, regular rhythm, normal heart sounds and intact distal pulses.  Exam reveals no gallop and no friction rub.   No murmur heard. Pulmonary/Chest: Effort normal and breath sounds normal. No respiratory distress. She has no wheezes. She has no rales. She exhibits no tenderness.  Abdominal: Soft. Bowel sounds are normal. She exhibits no  distension and no mass. There is no tenderness. There is no rebound and no guarding.  Musculoskeletal: Normal range of motion. She exhibits no edema and no tenderness.  Lymphadenopathy:    She has no cervical adenopathy.  Neurological: She is alert and oriented to person, place, and time. She has normal reflexes. No cranial nerve deficit. She exhibits normal muscle tone. Coordination normal.  Skin: Skin is warm and dry. No rash noted. No erythema.  Psychiatric: She has a normal mood and affect. Her behavior is normal. Judgment and thought content normal.          Assessment & Plan:  Well exam. She seems to do better when we keep the potassium at the upper part of the normal range, so we will increase the Klor-con to a total of 20 mEq daily. Recheck prn

## 2012-10-23 ENCOUNTER — Ambulatory Visit: Payer: 59 | Admitting: Dietician

## 2012-11-09 ENCOUNTER — Other Ambulatory Visit: Payer: Self-pay

## 2012-11-13 ENCOUNTER — Encounter: Payer: Self-pay | Admitting: Family Medicine

## 2012-11-14 MED ORDER — PROMETHAZINE HCL 25 MG PO TABS: 25.0000 mg | ORAL_TABLET | ORAL | Status: DC | PRN

## 2012-11-14 NOTE — Telephone Encounter (Signed)
Call in Promethazine 25 mg q 4 hours prn nausea, #60 with 2 rf

## 2013-01-17 ENCOUNTER — Telehealth: Payer: Self-pay | Admitting: Family Medicine

## 2013-01-17 DIAGNOSIS — I639 Cerebral infarction, unspecified: Secondary | ICD-10-CM

## 2013-01-17 HISTORY — DX: Cerebral infarction, unspecified: I63.9

## 2013-01-17 NOTE — Telephone Encounter (Signed)
Patient Information:  Caller Name: Talous  Phone: [phone]  Patient: Jakera, [name]  Gender: Female  DOB: 1975-03-20  Age: 38 Years  PCP: Alysia Penna [facility])  Pregnant: No  Office Follow Up:  Does the office need to follow up with this patient?: No  Instructions For The Office: N/A  RN Note:  Advised husband to call 911 now. To do so now.  Symptoms  Reason For Call & Symptoms: Husband calling regarding pt's confusion, blackness in one eye and trouble speaking some words. No hx of stroke.  Reviewed Health History In EMR: Yes  Reviewed Medications In EMR: Yes  Reviewed Allergies In EMR: Yes  Reviewed Surgeries / Procedures: Yes  Date of Onset of Symptoms: 01/17/2013 OB / GYN:  LMP: Unknown  Guideline(s) Used:  Neurologic Deficit  Disposition Per Guideline:   Call EMS 911 Now  Reason For Disposition Reached:   Difficult to awaken or acting confused (e.g., disoriented, slurred speech)  Advice Given:  N/A  Patient Will Follow Care Advice:  YES

## 2013-01-17 NOTE — Telephone Encounter (Signed)
FYI

## 2013-01-29 ENCOUNTER — Encounter: Payer: Self-pay | Admitting: Family Medicine

## 2013-01-29 ENCOUNTER — Ambulatory Visit (INDEPENDENT_AMBULATORY_CARE_PROVIDER_SITE_OTHER): Payer: 59 | Admitting: Family Medicine

## 2013-01-29 ENCOUNTER — Ambulatory Visit (INDEPENDENT_AMBULATORY_CARE_PROVIDER_SITE_OTHER): Payer: 59 | Admitting: General Practice

## 2013-01-29 VITALS — BP 110/78 | HR 92 | Temp 97.9°F | Ht 60.5 in | Wt 100.0 lb

## 2013-01-29 DIAGNOSIS — G459 Transient cerebral ischemic attack, unspecified: Secondary | ICD-10-CM

## 2013-01-29 DIAGNOSIS — Z5181 Encounter for therapeutic drug level monitoring: Secondary | ICD-10-CM | POA: Insufficient documentation

## 2013-01-29 DIAGNOSIS — I7789 Other specified disorders of arteries and arterioles: Secondary | ICD-10-CM

## 2013-01-29 DIAGNOSIS — I7771 Dissection of carotid artery: Secondary | ICD-10-CM

## 2013-01-29 DIAGNOSIS — I773 Arterial fibromuscular dysplasia: Secondary | ICD-10-CM

## 2013-01-29 LAB — POCT INR: INR: 2.4

## 2013-01-29 NOTE — Progress Notes (Signed)
Pre-visit discussion using our clinic review tool. No additional management support is needed unless otherwise documented below in the visit note.  

## 2013-01-29 NOTE — Progress Notes (Signed)
Pre visit review using our clinic review tool, if applicable. No additional management support is needed unless otherwise documented below in the visit note. 

## 2013-01-30 ENCOUNTER — Encounter: Payer: Self-pay | Admitting: Family Medicine

## 2013-01-30 DIAGNOSIS — I7771 Dissection of carotid artery: Secondary | ICD-10-CM | POA: Insufficient documentation

## 2013-01-30 DIAGNOSIS — I773 Arterial fibromuscular dysplasia: Secondary | ICD-10-CM | POA: Insufficient documentation

## 2013-01-30 NOTE — Progress Notes (Signed)
   Subjective:    Patient ID: [name], female    DOB: Oct 18, 1975, 38 y.o.   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 An MRI showed beading of the right ICA as well, consistent with fibromuscular dysplasia. She presented on the morning of admission with transient loss of vision in the right eye and with problems with expressive speech. No HAs. Workup included an ECHO and arterial dopplers of the renal arteries that were normal. She was under the care of a neurologist by the name of Dr. Terrill Mohr. She was stabilized and was anticoagulated. She was started on Coumadin and was sent home when her INR got to 2.5. She has been taking 4 mg daily of Coumadin since then. She has felt fine since her DC with no symptoms at all. She will probably be on lifetime anticoagulation but we will see.    Review of Systems  Constitutional: Negative.   Eyes: Negative.   Respiratory: Negative.   Cardiovascular: Negative.   Neurological: Negative.        Objective:   Physical Exam  Constitutional: She is oriented to person, place, and time. She appears well-developed and well-nourished.  Eyes: Conjunctivae and EOM are normal. Pupils are equal, round, and reactive to light.  Neck: Normal range of motion. Neck supple. No thyromegaly present.  Cardiovascular: Normal rate, regular rhythm, normal heart sounds and intact distal pulses.   Pulmonary/Chest: Effort normal and breath sounds normal.  Lymphadenopathy:    She has no cervical adenopathy.  Neurological: She is alert and oriented to person, place, and time. She has normal reflexes. No cranial nerve deficit. She exhibits normal muscle tone. Coordination normal.          Assessment & Plan:  She is doing well after a LICA dissection and a TIA. She is to follow up with Neurology  later this week. We will check an INR today and follow these closely.

## 2013-02-05 ENCOUNTER — Ambulatory Visit (INDEPENDENT_AMBULATORY_CARE_PROVIDER_SITE_OTHER): Payer: 59 | Admitting: General Practice

## 2013-02-05 DIAGNOSIS — G459 Transient cerebral ischemic attack, unspecified: Secondary | ICD-10-CM

## 2013-02-05 DIAGNOSIS — Z5181 Encounter for therapeutic drug level monitoring: Secondary | ICD-10-CM

## 2013-02-05 LAB — POCT INR: INR: 2.3

## 2013-02-05 NOTE — Progress Notes (Signed)
Pre-visit discussion using our clinic review tool. No additional management support is needed unless otherwise documented below in the visit note.  

## 2013-02-12 ENCOUNTER — Ambulatory Visit: Payer: 59

## 2013-02-12 ENCOUNTER — Ambulatory Visit (INDEPENDENT_AMBULATORY_CARE_PROVIDER_SITE_OTHER): Payer: 59 | Admitting: General Practice

## 2013-02-12 DIAGNOSIS — Z5181 Encounter for therapeutic drug level monitoring: Secondary | ICD-10-CM

## 2013-02-12 DIAGNOSIS — G459 Transient cerebral ischemic attack, unspecified: Secondary | ICD-10-CM

## 2013-02-12 LAB — POCT INR: INR: 2

## 2013-02-12 NOTE — Progress Notes (Signed)
Pre-visit discussion using our clinic review tool. No additional management support is needed unless otherwise documented below in the visit note.  

## 2013-02-16 ENCOUNTER — Other Ambulatory Visit: Payer: Self-pay | Admitting: General Practice

## 2013-02-16 ENCOUNTER — Telehealth: Payer: Self-pay | Admitting: Family Medicine

## 2013-02-16 MED ORDER — WARFARIN SODIUM 4 MG PO TABS: ORAL_TABLET | ORAL | Status: DC

## 2013-02-16 NOTE — Telephone Encounter (Signed)
Pt needs refill on warfarin 4 mg. [facility] [address]. Pt does not have enough med to last thru weekend. Please call med into pharm today

## 2013-02-19 ENCOUNTER — Ambulatory Visit (INDEPENDENT_AMBULATORY_CARE_PROVIDER_SITE_OTHER): Payer: 59 | Admitting: General Practice

## 2013-02-19 ENCOUNTER — Ambulatory Visit: Payer: 59

## 2013-02-19 DIAGNOSIS — G459 Transient cerebral ischemic attack, unspecified: Secondary | ICD-10-CM

## 2013-02-19 DIAGNOSIS — Z5181 Encounter for therapeutic drug level monitoring: Secondary | ICD-10-CM

## 2013-02-19 DIAGNOSIS — Z7901 Long term (current) use of anticoagulants: Secondary | ICD-10-CM

## 2013-02-19 LAB — POCT INR: INR: 2.5

## 2013-02-19 NOTE — Progress Notes (Signed)
Pre-visit discussion using our clinic review tool. No additional management support is needed unless otherwise documented below in the visit note.  

## 2013-03-05 ENCOUNTER — Ambulatory Visit (INDEPENDENT_AMBULATORY_CARE_PROVIDER_SITE_OTHER): Payer: 59 | Admitting: Family

## 2013-03-05 DIAGNOSIS — Z5181 Encounter for therapeutic drug level monitoring: Secondary | ICD-10-CM

## 2013-03-05 DIAGNOSIS — G459 Transient cerebral ischemic attack, unspecified: Secondary | ICD-10-CM

## 2013-03-05 LAB — POCT INR: INR: 1.9

## 2013-03-05 NOTE — Progress Notes (Signed)
Pre visit review using our clinic review tool, if applicable. No additional management support is needed unless otherwise documented below in the visit note. 

## 2013-03-05 NOTE — Patient Instructions (Signed)
Take an extra 1/2 tab today only then Continue to take 4 mg daily.  Re-check in 3 week.  Anticoagulation Dose Instructions as of 03/05/2013     Dorene Grebe Tue Wed Thu Fri Sat   New Dose 4 mg 4 mg 4 mg 4 mg 4 mg 4 mg 4 mg    Description       Take an extra 1/2 tab today only then Continue to take 4 mg daily.  Re-check in 3 week.

## 2013-03-12 ENCOUNTER — Ambulatory Visit: Payer: 59

## 2013-03-26 ENCOUNTER — Ambulatory Visit (INDEPENDENT_AMBULATORY_CARE_PROVIDER_SITE_OTHER): Payer: 59 | Admitting: General Practice

## 2013-03-26 DIAGNOSIS — G459 Transient cerebral ischemic attack, unspecified: Secondary | ICD-10-CM

## 2013-03-26 DIAGNOSIS — Z5181 Encounter for therapeutic drug level monitoring: Secondary | ICD-10-CM

## 2013-03-26 LAB — POCT INR: INR: 2.1

## 2013-03-26 NOTE — Progress Notes (Signed)
Pre visit review using our clinic review tool, if applicable. No additional management support is needed unless otherwise documented below in the visit note. 

## 2013-04-09 ENCOUNTER — Ambulatory Visit (INDEPENDENT_AMBULATORY_CARE_PROVIDER_SITE_OTHER): Payer: 59 | Admitting: General Practice

## 2013-04-09 DIAGNOSIS — Z5181 Encounter for therapeutic drug level monitoring: Secondary | ICD-10-CM

## 2013-04-09 DIAGNOSIS — G459 Transient cerebral ischemic attack, unspecified: Secondary | ICD-10-CM

## 2013-04-09 LAB — POCT INR: INR: 1.8

## 2013-04-09 NOTE — Progress Notes (Signed)
Pre visit review using our clinic review tool, if applicable. No additional management support is needed unless otherwise documented below in the visit note. 

## 2013-04-23 ENCOUNTER — Ambulatory Visit (INDEPENDENT_AMBULATORY_CARE_PROVIDER_SITE_OTHER): Payer: 59 | Admitting: General Practice

## 2013-04-23 DIAGNOSIS — G459 Transient cerebral ischemic attack, unspecified: Secondary | ICD-10-CM

## 2013-04-23 DIAGNOSIS — Z5181 Encounter for therapeutic drug level monitoring: Secondary | ICD-10-CM

## 2013-04-23 LAB — POCT INR: INR: 2.3

## 2013-04-23 NOTE — Progress Notes (Signed)
Pre visit review using our clinic review tool, if applicable. No additional management support is needed unless otherwise documented below in the visit note. 

## 2013-05-14 ENCOUNTER — Ambulatory Visit: Payer: 59

## 2013-05-14 ENCOUNTER — Ambulatory Visit (INDEPENDENT_AMBULATORY_CARE_PROVIDER_SITE_OTHER): Payer: 59 | Admitting: Family

## 2013-05-14 DIAGNOSIS — G459 Transient cerebral ischemic attack, unspecified: Secondary | ICD-10-CM

## 2013-05-14 DIAGNOSIS — Z5181 Encounter for therapeutic drug level monitoring: Secondary | ICD-10-CM

## 2013-05-14 LAB — POCT INR: INR: 2.6

## 2013-05-14 NOTE — Patient Instructions (Signed)
Continue to take 4 mg all days except take 6 mg on M/W/F.  Re-check in 4 weeks.  Anticoagulation Dose Instructions as of 05/14/2013     Dorene Grebe Tue Wed Thu Fri Sat   New Dose 4 mg 6 mg 4 mg 6 mg 4 mg 6 mg 4 mg    Description       Continue to take 4 mg all days except take 6 mg on M/W/F.  Re-check in 4 weeks.

## 2013-06-06 ENCOUNTER — Ambulatory Visit (INDEPENDENT_AMBULATORY_CARE_PROVIDER_SITE_OTHER): Payer: 59 | Admitting: General Practice

## 2013-06-06 ENCOUNTER — Encounter: Payer: Self-pay | Admitting: Family Medicine

## 2013-06-06 DIAGNOSIS — G459 Transient cerebral ischemic attack, unspecified: Secondary | ICD-10-CM

## 2013-06-06 DIAGNOSIS — Z5181 Encounter for therapeutic drug level monitoring: Secondary | ICD-10-CM

## 2013-06-06 DIAGNOSIS — I773 Arterial fibromuscular dysplasia: Secondary | ICD-10-CM

## 2013-06-07 NOTE — Telephone Encounter (Signed)
Referral was done  

## 2013-06-11 ENCOUNTER — Other Ambulatory Visit: Payer: Self-pay | Admitting: Family Medicine

## 2013-06-11 ENCOUNTER — Ambulatory Visit: Payer: 59

## 2013-06-13 ENCOUNTER — Other Ambulatory Visit: Payer: Self-pay | Admitting: *Deleted

## 2013-06-13 DIAGNOSIS — I7771 Dissection of carotid artery: Secondary | ICD-10-CM

## 2013-07-28 ENCOUNTER — Other Ambulatory Visit: Payer: Self-pay | Admitting: Family Medicine

## 2013-08-07 ENCOUNTER — Encounter: Payer: Self-pay | Admitting: Vascular Surgery

## 2013-08-08 ENCOUNTER — Other Ambulatory Visit: Payer: Self-pay | Admitting: Vascular Surgery

## 2013-08-08 ENCOUNTER — Encounter: Payer: Self-pay | Admitting: Vascular Surgery

## 2013-08-08 ENCOUNTER — Ambulatory Visit (INDEPENDENT_AMBULATORY_CARE_PROVIDER_SITE_OTHER): Payer: 59 | Admitting: Vascular Surgery

## 2013-08-08 ENCOUNTER — Ambulatory Visit (HOSPITAL_COMMUNITY)
Admission: RE | Admit: 2013-08-08 | Discharge: 2013-08-08 | Disposition: A | Discharge status: Home / Self Care | Payer: 59 | Source: Ambulatory Visit | Attending: Vascular Surgery | Admitting: Vascular Surgery

## 2013-08-08 VITALS — BP 127/95 | HR 90 | Ht 60.0 in | Wt 100.0 lb

## 2013-08-08 DIAGNOSIS — I773 Arterial fibromuscular dysplasia: Secondary | ICD-10-CM

## 2013-08-08 DIAGNOSIS — I7789 Other specified disorders of arteries and arterioles: Secondary | ICD-10-CM | POA: Diagnosis present

## 2013-08-08 DIAGNOSIS — I7771 Dissection of carotid artery: Secondary | ICD-10-CM

## 2013-08-08 NOTE — Progress Notes (Signed)
Patient ID: [name], female   DOB: 04/28/1975, 38 y.o.   MRN: [id number]  Reason for Consult: Carotid artery dissection   Referred by Laurey Morale, MD  Subjective:     HPI:  [name] is a 38 y.o. female Who had an episode of transient loss of vision in the right eye and expressive aphasia in January of 2015. Her workup for this included an MRA. 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 She was on Coumadin for this until June which time it was discontinued by her neurologist. At the time of her initial event in January, her symptoms resolved fairly quickly. She denied any focal weakness or paresthesias at that time. Since then she's had no neurologic symptoms except recently she's had some pain and numbness in her left arm and also some in her right thigh but she attributes this to moving that she has done recently. She's had no further expressive aphasia or amaurosis fugax.  She is no longer on Coumadin.  Past Medical History  Diagnosis Date  . Allergy   . Chronic headaches   . Hypoglycemia   . Asthma     sees Dr. Harold Hedge   . Paresthesia of left upper limb     saw Dr. Eddie North   . Stroke    Family History  Problem Relation Age of Onset  . Diabetes Father   . Heart disease Father   . Hypertension Father   . Varicose Veins Father   . Bleeding Disorder Mother    Past Surgical History  Procedure Laterality Date  . Appendectomy     Short Social History:  History  Substance Use Topics  . Smoking status: Never Smoker   . Smokeless tobacco: Never Used  . Alcohol Use: No     Comment: occ   Allergies  Allergen Reactions  . Cefdinir     REACTION: diarrhea  . Cefuroxime Axetil     REACTION: diarrhea  . Codeine   . Red Dye Hives  . Strawberry Hives   Current Outpatient Prescriptions  Medication Sig Dispense Refill  . aspirin 81 MG tablet Take 81 mg by mouth daily.      . Calcium Carbonate-Vitamin D (CALCIUM-VITAMIN D) 500-200 MG-UNIT per  tablet Take 1 tablet by mouth daily.        . Dapsone (ACZONE) 5 % topical gel Apply 1 application topically 2 (two) times daily.      . fexofenadine-pseudoephedrine (ALLEGRA-D) 60-120 MG per tablet Take 1 tablet by mouth daily. 12 hour      . hydrocortisone 2.5 % cream Apply 1 application topically as needed.      Marland Kitchen KLOR-CON 10 10 MEQ tablet TAKE ONE TABLET BY MOUTH TWICE DAILY   60 tablet  1  . levalbuterol (XOPENEX HFA) 45 MCG/ACT inhaler Inhale 1-2 puffs into the lungs every 4 (four) hours as needed for wheezing.      . Magnesium 250 MG TABS Take by mouth daily.      . minocycline (MINOCIN,DYNACIN) 100 MG capsule Take 100 mg by mouth daily as needed.       . Multiple Vitamins-Minerals (MULTIVITAMIN,TX-MINERALS) tablet Take 1 tablet by mouth daily.        . Olopatadine HCl (PATANASE NA) Place 2 sprays into the nose 2 (two) times daily.      . promethazine (PHENERGAN) 25 MG tablet Take 1 tablet (25 mg total) by mouth every 4 (four) hours as needed for nausea  or vomiting.  60 tablet  2  . Sulfacetamide Sodium-Sulfur (SULFACETAMIDE SOD-SULFUR EX) Apply topically daily.      . SUMAtriptan (IMITREX) 100 MG tablet Take 1 tablet (100 mg total) by mouth as needed for migraine.  10 tablet  11  . warfarin (COUMADIN) 4 MG tablet Take as directed by coumadin clinic  35 tablet  3   No current facility-administered medications for this visit.   Review of Systems  Constitutional: Negative for chills and fever.  Eyes: Negative for loss of vision.  Respiratory: Positive for wheezing. Negative for cough.  Cardiovascular: Positive for dyspnea with exertion and palpitations. Negative for chest pain, chest tightness, claudication and orthopnea.  GI: Negative for blood in stool and vomiting.  GU: Negative for dysuria and hematuria.  Musculoskeletal: Negative for leg pain, joint pain and myalgias.  Skin: Positive for rash. Negative for wound.  Neurological: Positive for light-headedness. Negative for dizziness  and speech difficulty.  Hematologic: Negative for bruises/bleeds easily. Psychiatric: Negative for depressed mood.       Objective:  Objective  Filed Vitals:   08/08/13 1045  BP: 127/95  Pulse: 90  Height: 5' (1.524 m)  Weight: 100 lb (45.36 kg)  SpO2: 100%   Body mass index is 19.53 kg/(m^2).  Physical Exam  Constitutional: She is oriented to person, place, and time. She appears well-developed and well-nourished.  HENT:  Head: Normocephalic and atraumatic.  Neck: Neck supple. No JVD present. No thyromegaly present.  Cardiovascular: Normal rate, regular rhythm and normal heart sounds.  Exam reveals no friction rub.   No murmur heard. Pulses:      Femoral pulses are 2+ on the right side, and 2+ on the left side.      Popliteal pulses are 2+ on the right side, and 2+ on the left side.       Dorsalis pedis pulses are 2+ on the right side, and 2+ on the left side.  Pulmonary/Chest: Breath sounds normal. She has no wheezes. She has no rales.  Abdominal: Soft. Bowel sounds are normal. There is no tenderness.  I do not palpate an aneurysm. There are no abdominal bruits.  Musculoskeletal: Normal range of motion. She exhibits no edema.  Lymphadenopathy:    She has no cervical adenopathy.  Neurological: She is alert and oriented to person, place, and time. She has normal strength. No sensory deficit.  Skin: No lesion and no rash noted.  Psychiatric: She has a normal mood and affect.   Data: I have reviewed the MRA results from 01/17/2013. 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 There was a small outpouching of the right internal carotid artery in the upper cervical segment.  I have independently interpreted her carotid duplex scan today. We are unable to identify the internal carotid artery dissection seen on MRA on the left. However, this may be a distal location it could not be seen by duplex. There is no significant stenosis in either of the extracranial carotid  arteries. There are slightly elevated velocities in the mid to distal segment of the right internal carotid artery which may suggest fibromuscular dysplasia.      Assessment/Plan:    Fibromuscular dysplasia The patient's MRA in January of this year and carotid duplex scan today suggest possible fibromuscular dysplasia of the distal right internal carotid artery. However if she does in fact have FMD she is asymptomatic and I would not recommend any further aggressive workup and the she developed new symptoms. Based on her  MRA, it appears that her problems were were distal. Duplex may not adequately visualized the areas seen on her MRA. Likewise if she had future symptoms related to this disease this would likely have to be addressed by the neuroradiologist as we would not have these options from a surgical standpoint given that there was no significant extracranial carotid disease seen on her duplex today. I think he would be a good idea to take an aspirin a day and I have explained this to her. I'll be happy to see her back at any time if any new vascular issues arise.   Angelia Mould MD [facility]

## 2013-08-08 NOTE — Assessment & Plan Note (Signed)
The patient's MRA in January of this year and carotid duplex scan today suggest possible fibromuscular dysplasia of the distal right internal carotid artery. However if she does in fact have FMD she is asymptomatic and I would not recommend any further aggressive workup and the she developed new symptoms. Based on her MRA, it appears that her problems were were distal. Duplex may not adequately visualized the areas seen on her MRA. Likewise if she had future symptoms related to this disease this would likely have to be addressed by the neuroradiologist as we would not have these options from a surgical standpoint given that there was no significant extracranial carotid disease seen on her duplex today. I think he would be a good idea to take an aspirin a day and I have explained this to her. I'll be happy to see her back at any time if any new vascular issues arise.

## 2013-09-28 ENCOUNTER — Other Ambulatory Visit: Payer: Self-pay | Admitting: Family Medicine

## 2013-10-10 ENCOUNTER — Ambulatory Visit (INDEPENDENT_AMBULATORY_CARE_PROVIDER_SITE_OTHER): Payer: 59 | Admitting: Family Medicine

## 2013-10-10 ENCOUNTER — Encounter: Payer: Self-pay | Admitting: Family Medicine

## 2013-10-10 VITALS — BP 111/75 | HR 89 | Temp 98.4°F | Ht 60.0 in | Wt 104.0 lb

## 2013-10-10 DIAGNOSIS — I773 Arterial fibromuscular dysplasia: Secondary | ICD-10-CM

## 2013-10-10 DIAGNOSIS — I7771 Dissection of carotid artery: Secondary | ICD-10-CM

## 2013-10-10 DIAGNOSIS — E876 Hypokalemia: Secondary | ICD-10-CM

## 2013-10-10 DIAGNOSIS — M542 Cervicalgia: Secondary | ICD-10-CM

## 2013-10-10 LAB — BASIC METABOLIC PANEL
BUN: 11 mg/dL (ref 6–23)
CO2: 25 meq/L (ref 19–32)
Calcium: 9.3 mg/dL (ref 8.4–10.5)
Chloride: 104 mEq/L (ref 96–112)
Creatinine, Ser: 0.5 mg/dL (ref 0.4–1.2)
GFR: 149.14 mL/min (ref >60.00)
Glucose, Bld: 78 mg/dL (ref 70–99)
POTASSIUM: 3.8 meq/L (ref 3.5–5.1)
SODIUM: 137 meq/L (ref 135–145)

## 2013-10-10 MED ORDER — POTASSIUM CHLORIDE ER 10 MEQ PO TBCR: EXTENDED_RELEASE_TABLET | ORAL | Status: DC

## 2013-10-10 NOTE — Progress Notes (Signed)
Pre visit review using our clinic review tool, if applicable. No additional management support is needed unless otherwise documented below in the visit note. 

## 2013-10-10 NOTE — Progress Notes (Signed)
   Subjective:    Patient ID: [name], female    DOB: April 07, 1975, 38 y.o.   MRN: [id number]  HPI Here for several issues. She has had stiffness and mild pain in the neck for years and she used to take yoga and chiropractic treatments for this. Then she developed a carotid dissection and was diagnosed with fibromuscular dysplasia. This has resolved and she had an unremarkable visit with Dr. Scot Dock in August. She has been told to never submit to chiropractic manipulations and for a time was tld to avoid yoga. Now she is having more stiffness and pain in the neck again and asks what she can do about it. She also asks for refills on her potassium pills. The last time her blood level was checked was over a year ago.    Review of Systems  Constitutional: Negative.   Respiratory: Negative.   Cardiovascular: Negative.   Musculoskeletal: Positive for neck pain and neck stiffness.  Neurological: Negative.        Objective:   Physical Exam  Constitutional: She is oriented to person, place, and time. She appears well-developed and well-nourished.  Eyes: Conjunctivae are normal. Pupils are equal, round, and reactive to light.  Neck: No thyromegaly present.  The posterior neck is mildly tender and her ROM is somewhat reduced   Lymphadenopathy:    She has no cervical adenopathy.  Neurological: She is alert and oriented to person, place, and time. No cranial nerve deficit.          Assessment & Plan:  I think she would benefit from low intensity yoga, and I recommended a yoga studio to go to that specializes in low intensity exercises. I reassured her that her current neck pain has nothing to do with the carotid artery issues mentioned above. We will check a BMET today

## 2013-11-07 ENCOUNTER — Encounter: Payer: Self-pay | Admitting: Family Medicine

## 2013-11-08 NOTE — Telephone Encounter (Signed)
Yes that would be okay to take

## 2014-05-28 ENCOUNTER — Other Ambulatory Visit: Payer: Self-pay | Admitting: Obstetrics and Gynecology

## 2014-06-06 ENCOUNTER — Ambulatory Visit (INDEPENDENT_AMBULATORY_CARE_PROVIDER_SITE_OTHER): Payer: 59 | Admitting: Family Medicine

## 2014-06-06 ENCOUNTER — Encounter: Payer: Self-pay | Admitting: Family Medicine

## 2014-06-06 VITALS — BP 100/80 | HR 86 | Temp 98.3°F | Ht 60.0 in | Wt 101.0 lb

## 2014-06-06 DIAGNOSIS — Z0000 Encounter for general adult medical examination without abnormal findings: Secondary | ICD-10-CM | POA: Diagnosis not present

## 2014-06-06 LAB — LIPID PANEL
CHOL/HDL RATIO: 2
CHOLESTEROL: 207 mg/dL — AB (ref 0–200)
HDL: 95.5 mg/dL (ref >39.00)
LDL Cholesterol: 102 mg/dL — ABNORMAL HIGH (ref 0–99)
NONHDL: 111.5
Triglycerides: 47 mg/dL (ref 0.0–149.0)
VLDL: 9.4 mg/dL (ref 0.0–40.0)

## 2014-06-06 LAB — CBC WITH DIFFERENTIAL/PLATELET
BASOS ABS: 0 10*3/uL (ref 0.0–0.1)
Basophils Relative: 0.5 % (ref 0.0–3.0)
EOS ABS: 0.1 10*3/uL (ref 0.0–0.7)
Eosinophils Relative: 2.3 % (ref 0.0–5.0)
HCT: 41.5 % (ref 36.0–46.0)
Hemoglobin: 14.1 g/dL (ref 12.0–15.0)
Lymphocytes Relative: 24.3 % (ref 12.0–46.0)
Lymphs Abs: 1.4 10*3/uL (ref 0.7–4.0)
MCHC: 33.9 g/dL (ref 30.0–36.0)
MCV: 92.1 fl (ref 78.0–100.0)
MONO ABS: 0.4 10*3/uL (ref 0.1–1.0)
Monocytes Relative: 7.6 % (ref 3.0–12.0)
Neutro Abs: 3.6 10*3/uL (ref 1.4–7.7)
Neutrophils Relative %: 65.3 % (ref 43.0–77.0)
Platelets: 207 10*3/uL (ref 150.0–400.0)
RBC: 4.5 Mil/uL (ref 3.87–5.11)
RDW: 12.9 % (ref 11.5–15.5)
WBC: 5.6 10*3/uL (ref 4.0–10.5)

## 2014-06-06 LAB — POCT URINALYSIS DIPSTICK
BILIRUBIN UA: NEGATIVE
GLUCOSE UA: NEGATIVE
Ketones, UA: NEGATIVE
Leukocytes, UA: NEGATIVE
Nitrite, UA: NEGATIVE
Protein, UA: NEGATIVE
RBC UA: NEGATIVE
Spec Grav, UA: 1.01
Urobilinogen, UA: 0.2
pH, UA: 7

## 2014-06-06 LAB — TSH: TSH: 1.68 u[IU]/mL (ref 0.35–4.50)

## 2014-06-06 LAB — HEPATIC FUNCTION PANEL
ALBUMIN: 4.8 g/dL (ref 3.5–5.2)
ALT: 10 U/L (ref 0–35)
AST: 21 U/L (ref 0–37)
Alkaline Phosphatase: 44 U/L (ref 39–117)
Bilirubin, Direct: 0.2 mg/dL (ref 0.0–0.3)
Total Bilirubin: 1.3 mg/dL — ABNORMAL HIGH (ref 0.2–1.2)
Total Protein: 7.5 g/dL (ref 6.0–8.3)

## 2014-06-06 LAB — BASIC METABOLIC PANEL
BUN: 10 mg/dL (ref 6–23)
CALCIUM: 9.8 mg/dL (ref 8.4–10.5)
CHLORIDE: 103 meq/L (ref 96–112)
CO2: 30 mEq/L (ref 19–32)
CREATININE: 0.63 mg/dL (ref 0.40–1.20)
GFR: 111.23 mL/min (ref >60.00)
GLUCOSE: 90 mg/dL (ref 70–99)
Potassium: 4.9 mEq/L (ref 3.5–5.1)
Sodium: 138 mEq/L (ref 135–145)

## 2014-06-06 MED ORDER — PROMETHAZINE HCL 25 MG PO TABS: 25.0000 mg | ORAL_TABLET | ORAL | Status: DC | PRN

## 2014-06-06 NOTE — Progress Notes (Signed)
Pre visit review using our clinic review tool, if applicable. No additional management support is needed unless otherwise documented below in the visit note. 

## 2014-06-06 NOTE — Progress Notes (Signed)
   Subjective:    Patient ID: [name], female    DOB: 1975-07-01, 39 y.o.   MRN: [id number]  HPI 39 yr old female for a cpx. She feels well and has no concerns. One year ago she had a stroke related to a dissection of the right internal carotid artery, and this was the result of some fibromuscular dysplasia in the vessel. Her neurologic deficits quickly resolved and she has had no further problems at all. She saw Dr. Cherly Beach at [facility] and he said he would need to see her only if she had more symptoms. She saw Dr. Gae Gallop of Vascular Surgery for the fibromuscular dysplasia, and he seemed to feel that no follow up imaging was needed. However both [name] and I are a little uneasy with this approach. She still takes an aspirin daily.    Review of Systems  Constitutional: Negative.   HENT: Negative.   Eyes: Negative.   Respiratory: Negative.   Cardiovascular: Negative.   Gastrointestinal: Negative.   Genitourinary: Negative for dysuria, urgency, frequency, hematuria, flank pain, decreased urine volume, enuresis, difficulty urinating, pelvic pain and dyspareunia.  Musculoskeletal: Negative.   Skin: Negative.   Neurological: Negative.   Psychiatric/Behavioral: Negative.        Objective:   Physical Exam  Constitutional: She is oriented to person, place, and time. She appears well-developed and well-nourished. No distress.  HENT:  Head: Normocephalic and atraumatic.  Right Ear: External ear normal.  Left Ear: External ear normal.  Nose: Nose normal.  Mouth/Throat: Oropharynx is clear and moist. No oropharyngeal exudate.  Eyes: Conjunctivae and EOM are normal. Pupils are equal, round, and reactive to light. No scleral icterus.  Neck: Normal range of motion. Neck supple. No JVD present. No thyromegaly present.  Cardiovascular: Normal rate, regular rhythm, normal heart sounds and intact distal pulses.  Exam reveals no gallop and no friction rub.   No murmur  heard. No carotid bruits and no abdominal bruits   Pulmonary/Chest: Effort normal and breath sounds normal. No respiratory distress. She has no wheezes. She has no rales. She exhibits no tenderness.  Abdominal: Soft. Bowel sounds are normal. She exhibits no distension and no mass. There is no tenderness. There is no rebound and no guarding.  Musculoskeletal: Normal range of motion. She exhibits no edema or tenderness.  Lymphadenopathy:    She has no cervical adenopathy.  Neurological: She is alert and oriented to person, place, and time. She has normal reflexes. No cranial nerve deficit. She exhibits normal muscle tone. Coordination normal.  Skin: Skin is warm and dry. No rash noted. No erythema.  Psychiatric: She has a normal mood and affect. Her behavior is normal. Judgment and thought content normal.          Assessment & Plan:  Well exam. Get fasting labs today. We agreed that she will set up a follow up visit with Dr. Scot Dock to make sure we do not need to be doing anything differently.

## 2014-06-07 ENCOUNTER — Other Ambulatory Visit: Payer: Self-pay | Admitting: *Deleted

## 2014-06-07 DIAGNOSIS — I773 Arterial fibromuscular dysplasia: Secondary | ICD-10-CM

## 2014-06-11 ENCOUNTER — Telehealth: Payer: Self-pay | Admitting: Vascular Surgery

## 2014-06-11 NOTE — Telephone Encounter (Signed)
Spoke with Apolonio Schneiders to schedule, dpm

## 2014-06-11 NOTE — Telephone Encounter (Signed)
-----   Message from Mena Goes, RN sent at 06/07/2014  3:54 PM EDT ----- Regarding: RE: Triage Contact: [phone] Please make this lady an appt with Dr. Scot Dock. She needs carotid duplex follow up on fibromuscular dysplasia, order is in EPIC. We will schedule tests for her legs after she sees [facility]. She is having vague symptoms in her legs but Dr. Sarajane Jews wants her to have recheck on her carotids.  ----- Message -----    From: Gena Fray    Sent: 06/07/2014  11:06 AM      To: [facility] Clinical Pool Subject: Triage                                         Please call Apolonio Schneiders at [phone]. She is an established pt of Dr Scot Dock. She has c/o leg pain. She saw her PCP yesterday who suggested she call our office to possibly have an ultrasound and follow up with [facility]. Could you please triage this call and let me know what type of follow up she needs.  Thanks! Hinton Dyer

## 2014-06-17 ENCOUNTER — Encounter: Payer: Self-pay | Admitting: Vascular Surgery

## 2014-06-19 ENCOUNTER — Ambulatory Visit (HOSPITAL_COMMUNITY)
Admission: RE | Admit: 2014-06-19 | Discharge: 2014-06-19 | Disposition: A | Discharge status: Home / Self Care | Payer: 59 | Source: Ambulatory Visit | Attending: Vascular Surgery | Admitting: Vascular Surgery

## 2014-06-19 ENCOUNTER — Ambulatory Visit (INDEPENDENT_AMBULATORY_CARE_PROVIDER_SITE_OTHER): Payer: 59 | Admitting: Vascular Surgery

## 2014-06-19 ENCOUNTER — Encounter: Payer: Self-pay | Admitting: Vascular Surgery

## 2014-06-19 VITALS — BP 109/79 | HR 91 | Ht 60.0 in | Wt 100.2 lb

## 2014-06-19 DIAGNOSIS — I773 Arterial fibromuscular dysplasia: Secondary | ICD-10-CM | POA: Insufficient documentation

## 2014-06-19 DIAGNOSIS — I6523 Occlusion and stenosis of bilateral carotid arteries: Secondary | ICD-10-CM | POA: Diagnosis not present

## 2014-06-19 NOTE — Progress Notes (Signed)
[facility]  Patient name: [name] MRN: [id number] DOB: [birth year] Sex: female  REASON FOR VISIT: Follow up  HPI: [name] is a 39 y.o. female  Who I saw in consultation on a 5:15 with carotid dissection. She had a transient loss of vision in the right eye with expressive aphasia in January 2015. MRI showed a left internal carotid artery dissection. She was on Coumadin for this until June that was discontinued by her neurologist at that time. Her carotid duplex scan at the time of her last visit showed slightly elevated velocities in the mid to distal segment of the right internal carotid artery suggesting possible fibromuscular dysplasia. She comes in for a 1 year follow up visit.  Incised so her last, she denies any history of stroke, TIAs, expressive or receptive aphasia, or amaurosis fugax. She is on aspirin.  Past Medical History  Diagnosis Date  . Allergy   . Chronic headaches   . Hypoglycemia   . Asthma     sees Dr. Harold Hedge   . Paresthesia of left upper limb     saw Dr. Eddie North   . Internal carotid artery dissection     saw Dr. Gae Gallop   . Fibromuscular dysplasia of cervicocranial artery   . Stroke     saw Dr. Cherly Beach at [facility] History  Problem Relation Age of Onset  . Diabetes Father   . Heart disease Father   . Hypertension Father   . Varicose Veins Father   . Bleeding Disorder Mother   . Varicose Veins Mother   . Hypertension Mother    SOCIAL HISTORY: History  Substance Use Topics  . Smoking status: Never Smoker   . Smokeless tobacco: Never Used  . Alcohol Use: 0.0 oz/week    0 Standard drinks or equivalent per week     Comment: rare   Allergies  Allergen Reactions  . Cefdinir     REACTION: diarrhea  . Cefuroxime Axetil     REACTION: diarrhea  . Codeine   . Red Dye Hives  . Strawberry Hives   Current Outpatient Prescriptions  Medication Sig Dispense Refill  . aspirin 81 MG tablet  Take 81 mg by mouth daily.    Marland Kitchen b complex vitamins tablet Take 1 tablet by mouth daily.    . Dapsone (ACZONE) 5 % topical gel Apply 1 application topically 2 (two) times daily.    . fexofenadine-pseudoephedrine (ALLEGRA-D) 60-120 MG per tablet Take 1 tablet by mouth daily. 12 hour    . levalbuterol (XOPENEX HFA) 45 MCG/ACT inhaler Inhale 1-2 puffs into the lungs every 4 (four) hours as needed for wheezing.    . Magnesium 250 MG TABS Take by mouth daily.    . Multiple Vitamins-Minerals (MULTIVITAMIN,TX-MINERALS) tablet Take 1 tablet by mouth daily.      . Olopatadine HCl (PATANASE NA) Place 2 sprays into the nose 2 (two) times daily.    . potassium chloride (K-DUR) 10 MEQ tablet TAKE ONE TABLET BY MOUTH TWICE DAILY 180 tablet 3  . promethazine (PHENERGAN) 25 MG tablet Take 1 tablet (25 mg total) by mouth every 4 (four) hours as needed for nausea or vomiting. 60 tablet 2  . Sulfacetamide Sodium-Sulfur (SULFACETAMIDE SOD-SULFUR EX) Apply topically daily.    . Calcium Carbonate-Vitamin D (CALCIUM-VITAMIN D) 500-200 MG-UNIT per tablet Take 1 tablet by mouth daily.      . hydrocortisone 2.5 % cream  Apply 1 application topically as needed.     No current facility-administered medications for this visit.   REVIEW OF SYSTEMS: Valu.Nieves ] denotes positive finding; [  ] denotes negative finding  CARDIOVASCULAR:  [ ]  chest pain   [ ]  chest pressure   [ ]  palpitations   [ ]  orthopnea   [ ]  dyspnea on exertion   [ ]  claudication   [ ]  rest pain   [ ]  DVT   [ ]  phlebitis PULMONARY:   [ ]  productive cough   [ ]  asthma   [ ]  wheezing NEUROLOGIC:   [ ]  weakness  Valu.Nieves ] paresthesias  [ ]  aphasia  [ ]  amaurosis  [ ]  dizziness HEMATOLOGIC:   [ ]  bleeding problems   [ ]  clotting disorders MUSCULOSKELETAL:  [ ]  joint pain   [ ]  joint swelling [ ]  leg swelling GASTROINTESTINAL: [ ]   blood in stool  [ ]   hematemesis GENITOURINARY:  [ ]   dysuria  [ ]   hematuria PSYCHIATRIC:  [ ]  history of major depression INTEGUMENTARY:  [  ] rashes  [ ]  ulcers CONSTITUTIONAL:  [ ]  fever   [ ]  chills  PHYSICAL EXAM: Filed Vitals:   06/19/14 1146 06/19/14 1150  BP: 123/85 109/79  Pulse: 91   Height: 5' (1.524 m)   Weight: 100 lb 3.2 oz (45.45 kg)   SpO2: 100%    GENERAL: The patient is a well-nourished female, in no acute distress. The vital signs are documented above. CARDIOVASCULAR: There is a regular rate and rhythm. I do not detect carotid bruits. PULMONARY: There is good air exchange bilaterally without wheezing or rales. ABDOMEN: Soft and non-tender with normal pitched bowel sounds.  MUSCULOSKELETAL: There are no major deformities or cyanosis. NEUROLOGIC: No focal weakness or paresthesias are detected. SKIN: There are no ulcers or rashes noted. PSYCHIATRIC: The patient has a normal affect.  DATA:  I have independently interpreted her carotid duplex scan which shows mildly elevated velocities in the distal internal carotid arteries bilaterally. These elevated velocities have not changed over the last year and a vaccine improved somewhat.  MEDICAL ISSUES: HISTORY OF CAROTID DISSECTION: Carotid duplex scan shows mildly elevated velocities in the distal internal carotid arteries possibly consistent with mild fibromuscular dysplasia. The scan has improved over the last year and for this reason I think it is safe to stretcher follow up out to 2 years. I'll see her back in 2 years with a follow up carotid duplex scan which I have ordered. In the meantime she will remain on her aspirin. She knows to call sooner if she has problems.   Return in about 2 years (around 06/18/2016).   Deitra Mayo [facility]: [phone]

## 2014-06-26 ENCOUNTER — Encounter (HOSPITAL_COMMUNITY): Payer: Self-pay

## 2014-06-26 ENCOUNTER — Encounter (HOSPITAL_COMMUNITY)
Admission: RE | Admit: 2014-06-26 | Discharge: 2014-06-26 | Disposition: A | Discharge status: Home / Self Care | Payer: 59 | Source: Ambulatory Visit | Attending: Psychiatry | Admitting: Psychiatry

## 2014-06-26 DIAGNOSIS — Z01818 Encounter for other preprocedural examination: Secondary | ICD-10-CM | POA: Diagnosis present

## 2014-06-26 LAB — CBC
HCT: 38.6 % (ref 36.0–46.0)
Hemoglobin: 13.6 g/dL (ref 12.0–15.0)
MCH: 31.6 pg (ref 26.0–34.0)
MCHC: 35.2 g/dL (ref 30.0–36.0)
MCV: 89.6 fL (ref 78.0–100.0)
Platelets: 237 10*3/uL (ref 150–400)
RBC: 4.31 MIL/uL (ref 3.87–5.11)
RDW: 12.4 % (ref 11.5–15.5)
WBC: 5.8 10*3/uL (ref 4.0–10.5)

## 2014-06-26 LAB — POTASSIUM: Potassium: 4 mmol/L (ref 3.5–5.1)

## 2014-06-26 NOTE — Pre-Procedure Instructions (Signed)
I consulted with Dr. Royce Macadamia re pt's h/o stroke last year. I asked if PT , PTT needed b/c pt on Aspirin and he said no. I asked it pt should stop aspirin and he said no.

## 2014-06-26 NOTE — Patient Instructions (Signed)
Your procedure is scheduled on:07/04/14  Enter through the Main Entrance at :12 noon Pick up desk phone and dial [phone] and inform us of your arrival.  Please call [phone] if you have any problems the morning of surgery.  Remember: Do not eat food after midnight:WED Clear liquids are ok until:9am on Thursday   You may brush your teeth the morning of surgery.  Take these meds the morning of surgery with a sip of water: Potassium, Allegra  DO NOT wear jewelry, eye make-up, lipstick,body lotion, or dark fingernail polish.  (Polished toes are ok) You may wear deodorant.  If you are to be admitted after surgery, leave suitcase in car until your room has been assigned. Patients discharged on the day of surgery will not be allowed to drive home. Wear loose fitting, comfortable clothes for your ride home.

## 2014-06-27 ENCOUNTER — Other Ambulatory Visit (HOSPITAL_COMMUNITY): Payer: Self-pay | Admitting: Obstetrics and Gynecology

## 2014-06-27 NOTE — H&P (Signed)
[name] is a 39 y.o.  female G0 who presents for Laparoscopic Bilateral Ovarian Cystectomy because of bilateral endometriomas.  Since September 2015 the patient has had inter-menstrual bleeding that has been like a period each time.  Her flow will last for 5-7 days and will change her pad every 1-2 hours.  She reports severe cramping rated at 10/10 on a 10 point pain scale. Though they may stop her in her tracks,  the  pain is transient so she doesn't take medications for it.  A Hysteroscopy D & C in December 2015  did not reveal any endometrial masses and pathology was benign.  In February 2016 a pelvic ultrasound showed a complex right ovarian mass measuring 2.8 cm.  In May 2016 a pelvic ultrasound showed:  uterus:  4.06 x 4.50 x 3.60 cm, endometrium: 0.853 cm;  Right ovary: 3.46 x 2.58 x 8.25 cm with #2 follicles having "ground glass" appearance-1.3 cm and 1.1 cm;  Left ovary: 5.06 x 3.82 x 2.72 cm and a single 4.5 x 2.0 x 3.6 cm "ground glass" appearance ? endometrioma (unchanged from a previous 02/2014 study).  An OVA-1 test showed "low likelihood" of malignancy results.  Given the findings on ultrasound and discomfort of her menstrual cycle the patient has decided to proceed with surgical management of her ovarian masses.  Past Medical History  OB History: G: 0  GYN History: menarche: 39 YO    LMP: 06/07/14    Contracepton no method  The patient denies history of sexually transmitted disease.  Previous history of abnormal PAP smear  that was repeated and was normal.  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 acetaminophen (TYLENOL) 500 MG tablet Take 1,000 mg by mouth every 6 (six) hours as needed for moderate pain or headache.  Marland Kitchen aspirin 81 MG tablet Take 81 mg by mouth daily.  Marland Kitchen b complex vitamins tablet Take 1 tablet by mouth daily.  . Cholecalciferol (D3 SUPER STRENGTH) 2000 UNITS CAPS Take 1 capsule by mouth daily.  . Dapsone (ACZONE) 5 % topical gel Apply 1 application topically 2 (two) times daily as needed (acne).   . fexofenadine-pseudoephedrine (ALLEGRA-D) 60-120 MG per tablet Take 1 tablet by mouth daily. 12 hour  . Lactobacillus (PROBIOTIC ACIDOPHILUS PO) Take 1 capsule by mouth daily.  Marland Kitchen levalbuterol (XOPENEX HFA) 45 MCG/ACT inhaler Inhale 1-2 puffs into the lungs every 4 (four) hours as needed for wheezing or shortness of breath.   . Magnesium 250 MG TABS Take 1 tablet by mouth daily.   . Multiple Vitamins-Minerals (MULTIVITAMIN,TX-MINERALS) tablet Take 1 tablet by mouth daily.    . niacin 500 MG tablet Take 500 mg by mouth 2 (two) times daily with a meal.  . Olopatadine HCl (PATANASE NA) Place 2 sprays into the nose 2 (two) times daily.  . potassium chloride (K-DUR) 10 MEQ tablet TAKE ONE TABLET BY MOUTH TWICE DAILY (Patient taking differently: Take 10 mEq by mouth 2 (two) times daily. TAKE ONE TABLET BY MOUTH TWICE DAILY)  . promethazine (PHENERGAN) 25 MG tablet Take 1 tablet (25 mg total) by mouth every 4 (four) hours as  needed for nausea or vomiting. (Patient taking differently: Take 25 mg by mouth every 4 (four) hours as needed for nausea or vomiting (travel sickness). )  . Sulfacetamide Sodium-Sulfur (SULFACETAMIDE SOD-SULFUR EX) Apply 1 application topically daily.   Marland Kitchen tretinoin (RETIN-A) 0.025 % cream Apply 1 application topically at bedtime.   No facility-administered encounter medications on file as of 06/27/2014.    Allergies  Allergen Reactions  . Codeine Anaphylaxis    Ears swelled up, patient develops severe blisters which cause sloughing  of the skin  . Cefdinir     REACTION: diarrhea  . Cefuroxime Axetil     REACTION: diarrhea  . Red Dye Hives  . Strawberry Hives    Denies sensitivity to peanuts, shellfish, soy, latex or adhesives.  ROS: Admits to glasses, paresthesias in both lower extremities (negative work up)  but denies headache, vision changes, nasal congestion, dysphagia, tinnitus, dizziness, hoarseness, cough,  chest pain, shortness of breath, nausea, vomiting, diarrhea,constipation,  urinary frequency, urgency  dysuria, hematuria, vaginitis symptoms, pelvic pain, swelling of joints,easy bruising,  myalgias, arthralgias, skin rashes, unexplained weight loss and except as is mentioned in the history of present illness, patient's review of systems is otherwise negative.   Physical Exam  Bp: 108/66  P: 88  R: 20  Temperature:  99.1 degrees F orally   Weight:  101 lbs.  Height: 5'  BMI: 19.7  Neck: supple without masses or thyromegaly Lungs: clear to auscultation Heart: regular rate and rhythm Abdomen: soft, diffusely tender and no organomegaly Pelvic:EGBUS- wnl; vagina-normal rugae but tender; uterus-normal size, and tender cervix without lesions or motion tenderness; adnexae-bilateral  tenderness or masses Extremities:  no clubbing, cyanosis or edema   Assesment: Bilateral Endometriomas           Menometrorrhagia           Pelvic Pain   Disposition:  A discussion was held with patient regarding the indication for her procedure(s) along with the risks, which include but are not limited to: reaction to anesthesia, damage to adjacent organs, infection,  excessive bleeding, possible need for an open abdominal incision and possible oophorectomy.  The patient verbalized understanding of these risks and has consented to proceed with Laparoscopic Bilateral Ovarian Cystectomy at [facility] on July 04, 2014.   CSN# [id number]   Adarryl Goldammer J. Florene Glen, PA-C  for Dr. Harvie Bridge. Mancel Bale

## 2014-06-27 NOTE — Anesthesia Preprocedure Evaluation (Addendum)
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 Gae Gallop, no significant carotid plaque bilaterally   TIA (Associated with fibromuscular dysplasia and Left ICA dissection1/2015, no residual present)negative neurological ROS     GI/Hepatic negative GI ROS, Neg liver ROS,   Endo/Other  negative endocrine ROS  Renal/GU negative Renal ROS   Bilateral ovarian endometriomas    Musculoskeletal negative musculoskeletal ROS (+)   Abdominal (+)  Abdomen: soft.    Peds  Hematology 13/38   Anesthesia Other Findings   Reproductive/Obstetrics                         Anesthesia Physical Anesthesia Plan  ASA: II  Anesthesia Plan: General   Post-op Pain Management:    Induction: Intravenous  Airway Management Planned: Oral ETT  Additional Equipment:   Intra-op Plan:   Post-operative Plan: Extubation in OR  Informed Consent: I have reviewed the patients History and Physical, chart, labs and discussed the procedure including the risks, benefits and alternatives for the proposed anesthesia with the patient or authorized representative who has indicated his/her understanding and acceptance.     Plan Discussed with:   Anesthesia Plan Comments: (Hx of motion sickness, hx of L ICA dissection 01/2013)       Anesthesia Quick Evaluation

## 2014-07-04 ENCOUNTER — Encounter (HOSPITAL_COMMUNITY): Payer: Self-pay | Admitting: Emergency Medicine

## 2014-07-04 ENCOUNTER — Ambulatory Visit (HOSPITAL_COMMUNITY): Payer: 59 | Admitting: Anesthesiology

## 2014-07-04 ENCOUNTER — Ambulatory Visit (HOSPITAL_COMMUNITY)
Admission: RE | Admit: 2014-07-04 | Discharge: 2014-07-04 | Disposition: A | Discharge status: Home / Self Care | Payer: 59 | Source: Ambulatory Visit | Attending: Obstetrics and Gynecology | Admitting: Obstetrics and Gynecology

## 2014-07-04 ENCOUNTER — Encounter (HOSPITAL_COMMUNITY)
Admission: RE | Disposition: A | Discharge status: Home / Self Care | Payer: Self-pay | Source: Ambulatory Visit | Attending: Obstetrics and Gynecology

## 2014-07-04 DIAGNOSIS — N800 Endometriosis of uterus: Secondary | ICD-10-CM | POA: Diagnosis not present

## 2014-07-04 DIAGNOSIS — N831 Corpus luteum cyst: Secondary | ICD-10-CM | POA: Diagnosis not present

## 2014-07-04 DIAGNOSIS — Z881 Allergy status to other antibiotic agents status: Secondary | ICD-10-CM | POA: Insufficient documentation

## 2014-07-04 DIAGNOSIS — Z885 Allergy status to narcotic agent status: Secondary | ICD-10-CM | POA: Diagnosis not present

## 2014-07-04 DIAGNOSIS — Z8673 Personal history of transient ischemic attack (TIA), and cerebral infarction without residual deficits: Secondary | ICD-10-CM | POA: Diagnosis not present

## 2014-07-04 DIAGNOSIS — Z7982 Long term (current) use of aspirin: Secondary | ICD-10-CM | POA: Insufficient documentation

## 2014-07-04 DIAGNOSIS — Z888 Allergy status to other drugs, medicaments and biological substances status: Secondary | ICD-10-CM | POA: Diagnosis not present

## 2014-07-04 DIAGNOSIS — R102 Pelvic and perineal pain: Secondary | ICD-10-CM | POA: Diagnosis present

## 2014-07-04 DIAGNOSIS — Z79899 Other long term (current) drug therapy: Secondary | ICD-10-CM | POA: Diagnosis not present

## 2014-07-04 DIAGNOSIS — M25559 Pain in unspecified hip: Secondary | ICD-10-CM

## 2014-07-04 DIAGNOSIS — N858 Other specified noninflammatory disorders of uterus: Secondary | ICD-10-CM | POA: Diagnosis not present

## 2014-07-04 DIAGNOSIS — N801 Endometriosis of ovary: Secondary | ICD-10-CM | POA: Insufficient documentation

## 2014-07-04 DIAGNOSIS — N80129 Deep endometriosis of ovary, unspecified ovary: Secondary | ICD-10-CM

## 2014-07-04 HISTORY — PX: LAPAROSCOPIC OVARIAN CYSTECTOMY: SHX6248

## 2014-07-04 LAB — PREGNANCY, URINE: Preg Test, Ur: NEGATIVE

## 2014-07-04 SURGERY — EXCISION, CYST, OVARY, LAPAROSCOPIC
Anesthesia: General | Laterality: Bilateral

## 2014-07-04 MED ORDER — NEOSTIGMINE METHYLSULFATE 10 MG/10ML IV SOLN
INTRAVENOUS | Status: AC
  Filled 2014-07-04: qty 1

## 2014-07-04 MED ORDER — IBUPROFEN 600 MG PO TABS: ORAL_TABLET | ORAL | Status: DC

## 2014-07-04 MED ORDER — NEOSTIGMINE METHYLSULFATE 10 MG/10ML IV SOLN
INTRAVENOUS | Status: DC | PRN
  Administered 2014-07-04: 2 mg via INTRAVENOUS

## 2014-07-04 MED ORDER — ROCURONIUM BROMIDE 100 MG/10ML IV SOLN
INTRAVENOUS | Status: DC | PRN
  Administered 2014-07-04: 40 mg via INTRAVENOUS

## 2014-07-04 MED ORDER — METOCLOPRAMIDE HCL 5 MG/ML IJ SOLN
INTRAMUSCULAR | Status: AC
  Administered 2014-07-04: 10 mg via INTRAVENOUS
  Filled 2014-07-04: qty 2

## 2014-07-04 MED ORDER — MIDAZOLAM HCL 2 MG/2ML IJ SOLN
INTRAMUSCULAR | Status: DC | PRN
  Administered 2014-07-04: 2 mg via INTRAVENOUS

## 2014-07-04 MED ORDER — SCOPOLAMINE 1 MG/3DAYS TD PT72: 1.0000 | MEDICATED_PATCH | Freq: Once | TRANSDERMAL | Status: DC

## 2014-07-04 MED ORDER — LACTATED RINGERS IR SOLN
Status: DC | PRN
  Administered 2014-07-04: 3000 mL

## 2014-07-04 MED ORDER — TRAMADOL HCL 50 MG PO TABS: 50.0000 mg | ORAL_TABLET | Freq: Four times a day (QID) | ORAL | Status: DC | PRN

## 2014-07-04 MED ORDER — CLINDAMYCIN PHOSPHATE 900 MG/50ML IV SOLN
900.0000 mg | Freq: Once | INTRAVENOUS | Status: AC
  Administered 2014-07-04: 900 mg via INTRAVENOUS
  Filled 2014-07-04: qty 50

## 2014-07-04 MED ORDER — FENTANYL CITRATE (PF) 250 MCG/5ML IJ SOLN
INTRAMUSCULAR | Status: AC
  Filled 2014-07-04: qty 5

## 2014-07-04 MED ORDER — LIDOCAINE HCL (CARDIAC) 20 MG/ML IV SOLN
INTRAVENOUS | Status: AC
  Filled 2014-07-04: qty 5

## 2014-07-04 MED ORDER — PROPOFOL 10 MG/ML IV BOLUS
INTRAVENOUS | Status: AC
  Filled 2014-07-04: qty 20

## 2014-07-04 MED ORDER — ONDANSETRON HCL 4 MG/2ML IJ SOLN
INTRAMUSCULAR | Status: AC
  Filled 2014-07-04: qty 2

## 2014-07-04 MED ORDER — MEPERIDINE HCL 25 MG/ML IJ SOLN: 6.2500 mg | INTRAMUSCULAR | Status: DC | PRN

## 2014-07-04 MED ORDER — DEXAMETHASONE SODIUM PHOSPHATE 4 MG/ML IJ SOLN
INTRAMUSCULAR | Status: AC
  Filled 2014-07-04: qty 1

## 2014-07-04 MED ORDER — ONDANSETRON HCL 4 MG/2ML IJ SOLN
INTRAMUSCULAR | Status: DC | PRN
Start: 2014-07-04 — End: 2014-07-04
  Administered 2014-07-04: 4 mg via INTRAVENOUS

## 2014-07-04 MED ORDER — SCOPOLAMINE 1 MG/3DAYS TD PT72: 1.0000 | MEDICATED_PATCH | TRANSDERMAL | Status: DC

## 2014-07-04 MED ORDER — DEXAMETHASONE SODIUM PHOSPHATE 10 MG/ML IJ SOLN
INTRAMUSCULAR | Status: DC | PRN
  Administered 2014-07-04: 4 mg via INTRAVENOUS

## 2014-07-04 MED ORDER — BUPIVACAINE HCL (PF) 0.25 % IJ SOLN
INTRAMUSCULAR | Status: AC
  Filled 2014-07-04: qty 30

## 2014-07-04 MED ORDER — METOCLOPRAMIDE HCL 5 MG/ML IJ SOLN
10.0000 mg | Freq: Once | INTRAMUSCULAR | Status: AC
  Administered 2014-07-04: 10 mg via INTRAVENOUS

## 2014-07-04 MED ORDER — LIDOCAINE HCL (CARDIAC) 20 MG/ML IV SOLN
INTRAVENOUS | Status: DC | PRN
  Administered 2014-07-04: 100 mg via INTRAVENOUS

## 2014-07-04 MED ORDER — SCOPOLAMINE 1 MG/3DAYS TD PT72
MEDICATED_PATCH | TRANSDERMAL | Status: AC
  Filled 2014-07-04: qty 1

## 2014-07-04 MED ORDER — MIDAZOLAM HCL 2 MG/2ML IJ SOLN
INTRAMUSCULAR | Status: AC
  Filled 2014-07-04: qty 2

## 2014-07-04 MED ORDER — 0.9 % SODIUM CHLORIDE (POUR BTL) OPTIME
TOPICAL | Status: DC | PRN
  Administered 2014-07-04: 1000 mL

## 2014-07-04 MED ORDER — GLYCOPYRROLATE 0.2 MG/ML IJ SOLN
INTRAMUSCULAR | Status: DC | PRN
  Administered 2014-07-04: 0.4 mg via INTRAVENOUS

## 2014-07-04 MED ORDER — ROCURONIUM BROMIDE 100 MG/10ML IV SOLN
INTRAVENOUS | Status: AC
  Filled 2014-07-04: qty 1

## 2014-07-04 MED ORDER — KETOROLAC TROMETHAMINE 30 MG/ML IJ SOLN
INTRAMUSCULAR | Status: DC | PRN
  Administered 2014-07-04: 30 mg via INTRAVENOUS

## 2014-07-04 MED ORDER — LACTATED RINGERS IV SOLN
INTRAVENOUS | Status: DC
  Administered 2014-07-04 (×3): via INTRAVENOUS

## 2014-07-04 MED ORDER — PROPOFOL 10 MG/ML IV BOLUS
INTRAVENOUS | Status: DC | PRN
  Administered 2014-07-04: 160 mg via INTRAVENOUS

## 2014-07-04 MED ORDER — FENTANYL CITRATE (PF) 100 MCG/2ML IJ SOLN
INTRAMUSCULAR | Status: DC | PRN
  Administered 2014-07-04: 150 ug via INTRAVENOUS
  Administered 2014-07-04 (×2): 50 ug via INTRAVENOUS

## 2014-07-04 MED ORDER — BUPIVACAINE HCL (PF) 0.25 % IJ SOLN
INTRAMUSCULAR | Status: DC | PRN
  Administered 2014-07-04: 18 mL

## 2014-07-04 MED ORDER — PROMETHAZINE HCL 25 MG/ML IJ SOLN: 6.2500 mg | INTRAMUSCULAR | Status: DC | PRN

## 2014-07-04 MED ORDER — GLYCOPYRROLATE 0.2 MG/ML IJ SOLN
INTRAMUSCULAR | Status: AC
  Filled 2014-07-04: qty 3

## 2014-07-04 MED ORDER — KETOROLAC TROMETHAMINE 30 MG/ML IJ SOLN
INTRAMUSCULAR | Status: AC
  Filled 2014-07-04: qty 1

## 2014-07-04 SURGICAL SUPPLY — 33 items
CABLE HIGH FREQUENCY MONO STRZ (ELECTRODE) ×3 IMPLANT
CHLORAPREP W/TINT 26ML (MISCELLANEOUS) ×3 IMPLANT
CLOTH BEACON ORANGE TIMEOUT ST (SAFETY) ×3 IMPLANT
DRSG COVADERM PLUS 2X2 (GAUZE/BANDAGES/DRESSINGS) ×6 IMPLANT
DRSG OPSITE POSTOP 3X4 (GAUZE/BANDAGES/DRESSINGS) IMPLANT
DRSG TELFA 3X8 NADH (GAUZE/BANDAGES/DRESSINGS) ×3 IMPLANT
FORCEPS CUTTING 33CM 5MM (CUTTING FORCEPS) IMPLANT
FORCEPS CUTTING 45CM 5MM (CUTTING FORCEPS) IMPLANT
GLOVE BIO SURGEON STRL SZ7.5 (GLOVE) ×3 IMPLANT
GLOVE BIOGEL PI IND STRL 7.5 (GLOVE) ×1 IMPLANT
GLOVE BIOGEL PI INDICATOR 7.5 (GLOVE) ×2
GOWN STRL REUS W/TWL LRG LVL3 (GOWN DISPOSABLE) ×6 IMPLANT
LIQUID BAND (GAUZE/BANDAGES/DRESSINGS) ×3 IMPLANT
NEEDLE INSUFFLATION 120MM (ENDOMECHANICALS) IMPLANT
NS IRRIG 1000ML POUR BTL (IV SOLUTION) ×3 IMPLANT
PACK LAPAROSCOPY BASIN (CUSTOM PROCEDURE TRAY) ×3 IMPLANT
PAD POSITIONER PINK NONSTERILE (MISCELLANEOUS) ×3 IMPLANT
POUCH SPECIMEN RETRIEVAL 10MM (ENDOMECHANICALS) IMPLANT
PROTECTOR NERVE ULNAR (MISCELLANEOUS) ×3 IMPLANT
SET IRRIG TUBING LAPAROSCOPIC (IRRIGATION / IRRIGATOR) IMPLANT
SLEEVE XCEL OPT CAN 5 100 (ENDOMECHANICALS) IMPLANT
SOLUTION ELECTROLUBE (MISCELLANEOUS) IMPLANT
SUT MNCRL AB 3-0 PS2 27 (SUTURE) ×3 IMPLANT
SUT VICRYL 0 ENDOLOOP (SUTURE) IMPLANT
SUT VICRYL 0 TIES 12 18 (SUTURE) IMPLANT
SUT VICRYL 0 UR6 27IN ABS (SUTURE) ×3 IMPLANT
SYR 50ML LL SCALE MARK (SYRINGE) IMPLANT
TOWEL OR 17X24 6PK STRL BLUE (TOWEL DISPOSABLE) ×6 IMPLANT
TRAY FOLEY CATH SILVER 14FR (SET/KITS/TRAYS/PACK) ×3 IMPLANT
TROCAR XCEL NON-BLD 11X100MML (ENDOMECHANICALS) ×3 IMPLANT
TROCAR XCEL NON-BLD 5MMX100MML (ENDOMECHANICALS) ×3 IMPLANT
WARMER LAPAROSCOPE (MISCELLANEOUS) ×3 IMPLANT
WATER STERILE IRR 1000ML POUR (IV SOLUTION) ×3 IMPLANT

## 2014-07-04 NOTE — H&P (View-Only) (Signed)
[name] is a 39 y.o.  female G0 who presents for Laparoscopic Bilateral Ovarian Cystectomy because of bilateral endometriomas.  Since September 2015 the patient has had inter-menstrual bleeding that has been like a period each time.  Her flow will last for 5-7 days and will change her pad every 1-2 hours.  She reports severe cramping rated at 10/10 on a 10 point pain scale. Though they may stop her in her tracks,  the  pain is transient so she doesn't take medications for it.  A Hysteroscopy D & C in December 2015  did not reveal any endometrial masses and pathology was benign.  In February 2016 a pelvic ultrasound showed a complex right ovarian mass measuring 2.8 cm.  In May 2016 a pelvic ultrasound showed:  uterus:  4.06 x 4.50 x 3.60 cm, endometrium: 0.853 cm;  Right ovary: 3.46 x 2.58 x 2.11 cm with #2 follicles having "ground glass" appearance-1.3 cm and 1.1 cm;  Left ovary: 5.06 x 3.82 x 2.72 cm and a single 4.5 x 2.0 x 3.6 cm "ground glass" appearance ? endometrioma (unchanged from a previous 02/2014 study).  An OVA-1 test showed "low likelihood" of malignancy results.  Given the findings on ultrasound and discomfort of her menstrual cycle the patient has decided to proceed with surgical management of her ovarian masses.  Past Medical History  OB History: G: 0  GYN History: menarche: 39 YO    LMP: 06/07/14    Contracepton no method  The patient denies history of sexually transmitted disease.  Previous history of abnormal PAP smear  that was repeated and was normal.  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 acetaminophen (TYLENOL) 500 MG tablet Take 1,000 mg by mouth every 6 (six) hours as needed for moderate pain or headache.  Marland Kitchen aspirin 81 MG tablet Take 81 mg by mouth daily.  Marland Kitchen b complex vitamins tablet Take 1 tablet by mouth daily.  . Cholecalciferol (D3 SUPER STRENGTH) 2000 UNITS CAPS Take 1 capsule by mouth daily.  . Dapsone (ACZONE) 5 % topical gel Apply 1 application topically 2 (two) times daily as needed (acne).   . fexofenadine-pseudoephedrine (ALLEGRA-D) 60-120 MG per tablet Take 1 tablet by mouth daily. 12 hour  . Lactobacillus (PROBIOTIC ACIDOPHILUS PO) Take 1 capsule by mouth daily.  Marland Kitchen levalbuterol (XOPENEX HFA) 45 MCG/ACT inhaler Inhale 1-2 puffs into the lungs every 4 (four) hours as needed for wheezing or shortness of breath.   . Magnesium 250 MG TABS Take 1 tablet by mouth daily.   . Multiple Vitamins-Minerals (MULTIVITAMIN,TX-MINERALS) tablet Take 1 tablet by mouth daily.    . niacin 500 MG tablet Take 500 mg by mouth 2 (two) times daily with a meal.  . Olopatadine HCl (PATANASE NA) Place 2 sprays into the nose 2 (two) times daily.  . potassium chloride (K-DUR) 10 MEQ tablet TAKE ONE TABLET BY MOUTH TWICE DAILY (Patient taking differently: Take 10 mEq by mouth 2 (two) times daily. TAKE ONE TABLET BY MOUTH TWICE DAILY)  . promethazine (PHENERGAN) 25 MG tablet Take 1 tablet (25 mg total) by mouth every 4 (four) hours as  needed for nausea or vomiting. (Patient taking differently: Take 25 mg by mouth every 4 (four) hours as needed for nausea or vomiting (travel sickness). )  . Sulfacetamide Sodium-Sulfur (SULFACETAMIDE SOD-SULFUR EX) Apply 1 application topically daily.   Marland Kitchen tretinoin (RETIN-A) 0.025 % cream Apply 1 application topically at bedtime.   No facility-administered encounter medications on file as of 06/27/2014.    Allergies  Allergen Reactions  . Codeine Anaphylaxis    Ears swelled up, patient develops severe blisters which cause sloughing  of the skin  . Cefdinir     REACTION: diarrhea  . Cefuroxime Axetil     REACTION: diarrhea  . Red Dye Hives  . Strawberry Hives    Denies sensitivity to peanuts, shellfish, soy, latex or adhesives.  ROS: Admits to glasses, paresthesias in both lower extremities (negative work up)  but denies headache, vision changes, nasal congestion, dysphagia, tinnitus, dizziness, hoarseness, cough,  chest pain, shortness of breath, nausea, vomiting, diarrhea,constipation,  urinary frequency, urgency  dysuria, hematuria, vaginitis symptoms, pelvic pain, swelling of joints,easy bruising,  myalgias, arthralgias, skin rashes, unexplained weight loss and except as is mentioned in the history of present illness, patient's review of systems is otherwise negative.   Physical Exam  Bp: 108/66  P: 88  R: 20  Temperature:  99.1 degrees F orally   Weight:  101 lbs.  Height: 5'  BMI: 19.7  Neck: supple without masses or thyromegaly Lungs: clear to auscultation Heart: regular rate and rhythm Abdomen: soft, diffusely tender and no organomegaly Pelvic:EGBUS- wnl; vagina-normal rugae but tender; uterus-normal size, and tender cervix without lesions or motion tenderness; adnexae-bilateral  tenderness or masses Extremities:  no clubbing, cyanosis or edema   Assesment: Bilateral Endometriomas           Menometrorrhagia           Pelvic Pain   Disposition:  A discussion was held with patient regarding the indication for her procedure(s) along with the risks, which include but are not limited to: reaction to anesthesia, damage to adjacent organs, infection,  excessive bleeding, possible need for an open abdominal incision and possible oophorectomy.  The patient verbalized understanding of these risks and has consented to proceed with Laparoscopic Bilateral Ovarian Cystectomy at [facility] on July 04, 2014.   CSN# [id number]   Vadim Centola J. Florene Glen, PA-C  for Dr. Harvie Bridge. Mancel Bale

## 2014-07-04 NOTE — Op Note (Signed)
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  Approximately 5cm left ovarian cyst, 0.5cm right ovarian cyst and 2cm right corpus luteal cyst.  Small amount of adhesions of left ovary to posterior uterine wall and same on the right but even less.  Pathology: Left ovarian cyst and rt ovarian cysts  Fluids: 1500 cc  UOP: 100 cc  EBL: 5 cc  Complications: None  Procedure: The patient was taken to the operating room after the risks, benefits, alternatives, complications, treatment options, and expected outcomes were discussed with the patient. The patient verbalized understanding, the patient concurred with the proposed plan and consent signed and witnessed. The patient was taken to the Operating Room, identified as [name] and the procedure verified as laparoscopic bilateral tubal fulguration. A Time Out was held and the above information confirmed.  The patient was placed under general anesthesia per anesthesia staff, the patient was placed in modified dorsal lithotomy position and was prepped, draped, and catheterized in the normal, sterile fashion.  The cervix was visualized and an intrauterine manipulator was placed.  A 10 mm umbilical incision was then performed. Veress needle was passed and pneumoperitoneum was established. A 10 mm trocar was advanced into the intraabdominal cavity, the laparoscope was introduced and findings as noted above.  Patient was placed in trendelenburg and marcaine injected in the LLQ and a 5 mm incision was made and 5 mm trocar advanced into the intraabdominal cavity.  The same was done in the RLQ and a 10 mm in the suprapubic area.   Monopolar scissors were used to incise  over each cyst and cysts were dissected out without difficulty.  The ovarian beds were cauterized for hemostasis.  Interceed was placed on the posterior wall of uterus where some of the adhesions were removed and the surface left "raw" but not bleeding.  The left ovary was wrapped with a piece as well.  The 10 mm suprapubic trocar was removed and fascia repaired with 0 vicryl.  Right and left trocars were removed as well under direct visualization.  Pneumoperitoneum was relieved and umbilical trocar removed under direct visualization.  The umbilical fascia was repaired with 0 vicryl.  The 10 mm skin incisions were repaired with 3-0 monocryl via a subcuticular stitch and liquaband was applied to all incisions.  Sponge, instrument, lap and needle counts were correct.  The patient tolerated the procedure well and was awaiting extubation and transfer to the recovery room.

## 2014-07-04 NOTE — Transfer of Care (Signed)
Immediate Anesthesia Transfer of Care Note  Patient: [name]  Procedure(s) Performed: Procedure(s): LAPAROSCOPIC OVARIAN CYSTECTOMY (Bilateral)  Patient Location: PACU  Anesthesia Type:General  Level of Consciousness: awake, alert  and oriented  Airway & Oxygen Therapy: Patient Spontanous Breathing and Patient connected to nasal cannula oxygen  Post-op Assessment: Report given to RN, Post -op Vital signs reviewed and stable and Patient moving all extremities  Post vital signs: Reviewed and stable  Last Vitals:  Filed Vitals:   07/04/14 1217  BP: 113/64  Pulse: 98  Temp: 36.8 C  Resp: 16    Complications: No apparent anesthesia complications

## 2014-07-04 NOTE — Discharge Instructions (Signed)
Call [facility] @ [phone] if:  You have a temperature greater than or equal to 100.4 degrees Farenheit orally You have pain that is not made better by the pain medication given and taken as directed You have excessive bleeding or problems urinating  Take Colace (Docusate Sodium/Stool Softener) 100 mg 2-3 times daily while taking narcotic pain medicine to avoid constipation or until bowel movements are regular. Take Ibuprofen 600 mg with food every 6 hours for 5 days then as needed for pain  You may drive according to instructions from PACU You may walk up steps  You may shower tomorrow You may resume a regular diet  Keep incisions clean and dry Do not lift over 15 pounds for 6 weeks Avoid anything in vagina for 6 weeks (or until after your post-operative visit)  Keep follow up with Dr. Mancel Bale on July 18, 2014 at 8:45 a.m.   May take Ibuprofen after 9:30 pm as needed for cramps

## 2014-07-04 NOTE — Anesthesia Postprocedure Evaluation (Signed)
  Anesthesia Post-op Note  Patient: [name]  Procedure(s) Performed: Procedure(s): LAPAROSCOPIC OVARIAN CYSTECTOMY (Bilateral) Patient is awake and responsive. Pain and nausea are reasonably well controlled. Vital signs are stable and clinically acceptable. Oxygen saturation is clinically acceptable. There are no apparent anesthetic complications at this time. Patient is ready for discharge.

## 2014-07-04 NOTE — Interval H&P Note (Signed)
History and Physical Interval Note:  07/04/2014 1:07 PM  [name]  has presented today for surgery, with the diagnosis of Choclolate Cyst of the Ovary - 2 hours  The various methods of treatment have been discussed with the patient and family. After consideration of risks, benefits and other options for treatment, the patient has consented to  Procedure(s): LAPAROSCOPIC BILATERAL OVARIAN CYSTECTOMY (Bilateral) POSSIBLE LAPAROTOMY as a surgical intervention .  The patient's history has been reviewed, patient examined, no change in status, stable for surgery.  I have reviewed the patient's chart and labs.  Questions were answered to the patient's satisfaction.     Delice Lesch

## 2014-07-04 NOTE — Anesthesia Procedure Notes (Signed)
Procedure Name: Intubation Date/Time: 07/04/2014 1:23 PM Performed by: Hewitt Blade Pre-anesthesia Checklist: Patient identified, Emergency Drugs available, Suction available and Patient being monitored Patient Re-evaluated:Patient Re-evaluated prior to inductionOxygen Delivery Method: Circle system utilized Preoxygenation: Pre-oxygenation with 100% oxygen Intubation Type: IV induction Ventilation: Mask ventilation without difficulty Laryngoscope Size: Mac and 3 Grade View: Grade I Tube type: Oral Number of attempts: 1 Airway Equipment and Method: Stylet Placement Confirmation: ETT inserted through vocal cords under direct vision,  positive ETCO2 and breath sounds checked- equal and bilateral Secured at: 20 cm Tube secured with: Tape Dental Injury: Teeth and Oropharynx as per pre-operative assessment

## 2014-07-08 ENCOUNTER — Encounter (HOSPITAL_COMMUNITY): Payer: Self-pay | Admitting: Obstetrics and Gynecology

## 2014-10-30 ENCOUNTER — Other Ambulatory Visit: Payer: Self-pay | Admitting: Family Medicine

## 2014-10-31 NOTE — Telephone Encounter (Signed)
Can we refill this? 

## 2014-11-19 ENCOUNTER — Encounter: Payer: Self-pay | Admitting: Family Medicine

## 2014-11-19 NOTE — Telephone Encounter (Signed)
Please call in #[phone] with 3 rf

## 2014-11-20 MED ORDER — POTASSIUM CHLORIDE ER 10 MEQ PO TBCR: EXTENDED_RELEASE_TABLET | ORAL | Status: DC

## 2015-02-18 ENCOUNTER — Encounter: Payer: Self-pay | Admitting: Family Medicine

## 2015-02-18 ENCOUNTER — Ambulatory Visit (INDEPENDENT_AMBULATORY_CARE_PROVIDER_SITE_OTHER): Payer: 59 | Admitting: Family Medicine

## 2015-02-18 VITALS — BP 110/74 | Temp 98.7°F | Ht 60.0 in | Wt 102.0 lb

## 2015-02-18 DIAGNOSIS — J019 Acute sinusitis, unspecified: Secondary | ICD-10-CM

## 2015-02-18 MED ORDER — AZITHROMYCIN 250 MG PO TABS: ORAL_TABLET | ORAL | Status: DC

## 2015-02-18 NOTE — Progress Notes (Signed)
   Subjective:    Patient ID: [name], female    DOB: May 18, 1975, 40 y.o.   MRN: [id number]  HPI Here for 5 days of sinus pressure, decreased hearing in the left ear, PND, and blowing green mucus from the nose. No cough or fever. On Mucinex.    Review of Systems  Constitutional: Negative.   HENT: Positive for congestion, hearing loss, postnasal drip and sinus pressure. Negative for ear pain and sore throat.   Eyes: Negative.   Respiratory: Negative.        Objective:   Physical Exam  Constitutional: She appears well-developed and well-nourished.  HENT:  Right Ear: External ear normal.  Left Ear: External ear normal.  Nose: Nose normal.  Mouth/Throat: Oropharynx is clear and moist. No oropharyngeal exudate.  Eyes: Conjunctivae are normal.  Neck: No thyromegaly present.  Pulmonary/Chest: Effort normal and breath sounds normal.  Lymphadenopathy:    She has no cervical adenopathy.          Assessment & Plan:  Sinusitis, treat with a Zpack.

## 2015-02-18 NOTE — Progress Notes (Signed)
Pre visit review using our clinic review tool, if applicable. No additional management support is needed unless otherwise documented below in the visit note. 

## 2015-07-01 ENCOUNTER — Encounter: Payer: 59 | Admitting: Family Medicine

## 2015-07-10 ENCOUNTER — Encounter: Payer: Self-pay | Admitting: Family Medicine

## 2015-07-10 ENCOUNTER — Ambulatory Visit (INDEPENDENT_AMBULATORY_CARE_PROVIDER_SITE_OTHER): Payer: 59 | Admitting: Family Medicine

## 2015-07-10 VITALS — BP 114/82 | HR 84 | Temp 98.3°F | Ht 60.0 in | Wt 107.0 lb

## 2015-07-10 DIAGNOSIS — N809 Endometriosis, unspecified: Secondary | ICD-10-CM | POA: Diagnosis not present

## 2015-07-10 DIAGNOSIS — Z0000 Encounter for general adult medical examination without abnormal findings: Secondary | ICD-10-CM

## 2015-07-10 LAB — POC URINALSYSI DIPSTICK (AUTOMATED)
BILIRUBIN UA: NEGATIVE
GLUCOSE UA: NEGATIVE
Ketones, UA: NEGATIVE
Leukocytes, UA: NEGATIVE
NITRITE UA: NEGATIVE
PH UA: 7
PROTEIN UA: NEGATIVE
RBC UA: NEGATIVE
Spec Grav, UA: 1.01
Urobilinogen, UA: 0.2

## 2015-07-10 LAB — LIPID PANEL
CHOL/HDL RATIO: 2
Cholesterol: 196 mg/dL (ref 0–200)
HDL: 89.5 mg/dL (ref >39.00)
LDL CALC: 94 mg/dL (ref 0–99)
NONHDL: 106.05
Triglycerides: 60 mg/dL (ref 0.0–149.0)
VLDL: 12 mg/dL (ref 0.0–40.0)

## 2015-07-10 LAB — HEPATIC FUNCTION PANEL
ALK PHOS: 45 U/L (ref 39–117)
ALT: 11 U/L (ref 0–35)
AST: 20 U/L (ref 0–37)
Albumin: 4.6 g/dL (ref 3.5–5.2)
BILIRUBIN TOTAL: 1.5 mg/dL — AB (ref 0.2–1.2)
Bilirubin, Direct: 0.2 mg/dL (ref 0.0–0.3)
Total Protein: 6.8 g/dL (ref 6.0–8.3)

## 2015-07-10 LAB — CBC WITH DIFFERENTIAL/PLATELET
Basophils Absolute: 0 10*3/uL (ref 0.0–0.1)
Basophils Relative: 0.5 % (ref 0.0–3.0)
EOS ABS: 0.1 10*3/uL (ref 0.0–0.7)
Eosinophils Relative: 3.1 % (ref 0.0–5.0)
HEMATOCRIT: 38.8 % (ref 36.0–46.0)
Hemoglobin: 13.2 g/dL (ref 12.0–15.0)
LYMPHS ABS: 1.3 10*3/uL (ref 0.7–4.0)
LYMPHS PCT: 28 % (ref 12.0–46.0)
MCHC: 34 g/dL (ref 30.0–36.0)
MCV: 91.2 fl (ref 78.0–100.0)
MONOS PCT: 7.7 % (ref 3.0–12.0)
Monocytes Absolute: 0.4 10*3/uL (ref 0.1–1.0)
NEUTROS ABS: 2.8 10*3/uL (ref 1.4–7.7)
NEUTROS PCT: 60.7 % (ref 43.0–77.0)
Platelets: 227 10*3/uL (ref 150.0–400.0)
RBC: 4.26 Mil/uL (ref 3.87–5.11)
RDW: 12.7 % (ref 11.5–15.5)
WBC: 4.6 10*3/uL (ref 4.0–10.5)

## 2015-07-10 LAB — TSH: TSH: 1.41 u[IU]/mL (ref 0.35–4.50)

## 2015-07-10 LAB — BASIC METABOLIC PANEL
BUN: 14 mg/dL (ref 6–23)
CO2: 27 mEq/L (ref 19–32)
Calcium: 9.3 mg/dL (ref 8.4–10.5)
Chloride: 100 mEq/L (ref 96–112)
Creatinine, Ser: 0.58 mg/dL (ref 0.40–1.20)
GFR: 121.69 mL/min (ref >60.00)
Glucose, Bld: 82 mg/dL (ref 70–99)
POTASSIUM: 4 meq/L (ref 3.5–5.1)
SODIUM: 135 meq/L (ref 135–145)

## 2015-07-10 NOTE — Progress Notes (Signed)
   Subjective:    Patient ID: [name], female    DOB: Jul 13, 1975, 40 y.o.   MRN: [id number]  HPI 40 yr old female for a well exam. She feels well except for some chronic pelvic pain which has been diagnosed as endometriosis. Her GYN, Dr. Everett Graff, has enrolled her in a research study for an endometriosis medication, although she is still in the initial screening process.    Review of Systems  Constitutional: Negative.   HENT: Negative.   Eyes: Negative.   Respiratory: Negative.   Cardiovascular: Negative.   Gastrointestinal: Negative.   Genitourinary: Negative for dysuria, urgency, frequency, hematuria, flank pain, decreased urine volume, enuresis, difficulty urinating, pelvic pain and dyspareunia.  Musculoskeletal: Negative.   Skin: Negative.   Neurological: Negative.   Psychiatric/Behavioral: Negative.        Objective:   Physical Exam  Constitutional: She is oriented to person, place, and time. She appears well-developed and well-nourished. No distress.  HENT:  Head: Normocephalic and atraumatic.  Right Ear: External ear normal.  Left Ear: External ear normal.  Nose: Nose normal.  Mouth/Throat: Oropharynx is clear and moist. No oropharyngeal exudate.  Eyes: Conjunctivae and EOM are normal. Pupils are equal, round, and reactive to light. No scleral icterus.  Neck: Normal range of motion. Neck supple. No JVD present. No thyromegaly present.  Cardiovascular: Normal rate, regular rhythm, normal heart sounds and intact distal pulses.  Exam reveals no gallop and no friction rub.   No murmur heard. Pulmonary/Chest: Effort normal and breath sounds normal. No respiratory distress. She has no wheezes. She has no rales. She exhibits no tenderness.  Abdominal: Soft. Bowel sounds are normal. She exhibits no distension and no mass. There is no tenderness. There is no rebound and no guarding.  Musculoskeletal: Normal range of motion. She exhibits no edema or tenderness.    Lymphadenopathy:    She has no cervical adenopathy.  Neurological: She is alert and oriented to person, place, and time. She has normal reflexes. No cranial nerve deficit. She exhibits normal muscle tone. Coordination normal.  Skin: Skin is warm and dry. No rash noted. No erythema.  Psychiatric: She has a normal mood and affect. Her behavior is normal. Judgment and thought content normal.          Assessment & Plan:  Well exam. We discussed diet and exercise. Get fasting labs today.  Laurey Morale, MD

## 2015-07-10 NOTE — Progress Notes (Signed)
Pre visit review using our clinic review tool, if applicable. No additional management support is needed unless otherwise documented below in the visit note. 

## 2015-09-24 ENCOUNTER — Encounter: Payer: Self-pay | Admitting: Family Medicine

## 2015-09-24 ENCOUNTER — Ambulatory Visit (INDEPENDENT_AMBULATORY_CARE_PROVIDER_SITE_OTHER): Payer: 59 | Admitting: Family Medicine

## 2015-09-24 VITALS — BP 130/90 | HR 107 | Temp 98.4°F | Ht 60.0 in | Wt 104.2 lb

## 2015-09-24 DIAGNOSIS — J069 Acute upper respiratory infection, unspecified: Secondary | ICD-10-CM

## 2015-09-24 DIAGNOSIS — B9789 Other viral agents as the cause of diseases classified elsewhere: Principal | ICD-10-CM

## 2015-09-24 MED ORDER — BENZONATATE 200 MG PO CAPS: 200.0000 mg | ORAL_CAPSULE | Freq: Three times a day (TID) | ORAL | 0 refills | Status: DC | PRN

## 2015-09-24 NOTE — Patient Instructions (Signed)
Drink plenty of fluids Consider OTC Mucinex Consider OTC Delsym for cough and if no relief may try Tessalon.

## 2015-09-24 NOTE — Progress Notes (Signed)
Subjective:     Patient ID: [name], female   DOB: [birth year], 40 y.o.   MRN: [id number]  HPI Patient seen for acute visit with 2 day history of laryngitis, cough, nasal congestion. Her cough has been mostly dry. Denies any fevers or chills. No sore throat. 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 Denies any upper teeth pain or facial pain. She's not tried any over-the-counter medications.  Past Medical History:  Diagnosis Date  . Allergy   . Asthma    sees Dr. Harold Hedge   . Chronic headaches   . Fibromuscular dysplasia of cervicocranial artery ([place])   . Hypoglycemia   . Internal carotid artery dissection [facility])    saw Dr. Gae Gallop   . Paresthesia of left upper limb    saw Dr. Eddie North   . Stroke [facility])    age 54 dissection of left carotid artery   Past Surgical History:  Procedure Laterality Date  . APPENDECTOMY    . DILATION AND CURETTAGE OF UTERUS     hysteroscopy  . LAPAROSCOPIC OVARIAN CYSTECTOMY Bilateral 07/04/2014   Procedure: LAPAROSCOPIC OVARIAN CYSTECTOMY;  Surgeon: Everett Graff, MD;  Location: [facility];  Service: Gynecology;  Laterality: Bilateral;    reports that she has never smoked. She has never used smokeless tobacco. She reports that she drinks alcohol. She reports that she does not use drugs. family history includes Bleeding Disorder in her mother; Diabetes in her father; Heart disease in her father; Hypertension in her father and mother; Varicose Veins in her father and mother. Allergies  Allergen Reactions  . Codeine Anaphylaxis    Ears swelled up, patient develops severe blisters which cause sloughing of the skin  . Cefdinir     REACTION: diarrhea  . Cefuroxime Axetil     REACTION: diarrhea  . Red Dye Hives  . Strawberry Extract Hives     Review of Systems  Constitutional: Positive for fatigue. Negative for chills and fever.  HENT: Positive for congestion and voice change.   Respiratory: Positive for cough. Negative for  shortness of breath and wheezing.        Objective:   Physical Exam  Constitutional: She appears well-developed and well-nourished.  HENT:  Right Ear: External ear normal.  Left Ear: External ear normal.  Mouth/Throat: Oropharynx is clear and moist.  Neck: Neck supple.  Cardiovascular: Normal rate and regular rhythm.   Pulmonary/Chest: Effort normal and breath sounds normal. No respiratory distress. She has no wheezes. She has no rales.  Lymphadenopathy:    She has no cervical adenopathy.       Assessment:     Probable viral URI with laryngitis    Plan:     -Drink plenty fluids -Consider over-the-counter Mucinex twice daily -Tessalon Perles 200 mg every 8 hours as needed for cough if not relieved with over-the-counter cough medication -Follow-up with primary if symptoms not improving in 2 weeks and sooner as needed  Eulas Post MD [facility]

## 2015-09-24 NOTE — Progress Notes (Signed)
Pre visit review using our clinic review tool, if applicable. No additional management support is needed unless otherwise documented below in the visit note. 

## 2015-10-06 ENCOUNTER — Telehealth: Payer: Self-pay

## 2015-10-06 NOTE — Telephone Encounter (Signed)
Patient called requesting a letter from Dr. Scot Dock concerning overall health in reference to trying to adopt a child. Advised patient to contact her PCP as Dr. Scot Dock would only be able to speak on conditions that he sees her for. Patient was told to contact our offices with any additional questions or concerns. Patient verbalized understanding.

## 2015-10-08 ENCOUNTER — Encounter: Payer: Self-pay | Admitting: Family Medicine

## 2015-10-13 NOTE — Telephone Encounter (Signed)
I wrote a letter for each of them and they are ready to be picked up

## 2015-10-15 ENCOUNTER — Encounter: Payer: Self-pay | Admitting: Family Medicine

## 2015-10-16 ENCOUNTER — Encounter: Payer: Self-pay | Admitting: Family Medicine

## 2015-10-16 NOTE — Telephone Encounter (Signed)
The corrected letter is ready for pickup

## 2015-12-13 ENCOUNTER — Other Ambulatory Visit: Payer: Self-pay | Admitting: Family Medicine

## 2016-02-26 ENCOUNTER — Encounter: Payer: Self-pay | Admitting: Family Medicine

## 2016-02-26 NOTE — Telephone Encounter (Signed)
Pt will wait until hearing back from adoption agency before dropping off forms

## 2016-02-27 ENCOUNTER — Encounter: Payer: Self-pay | Admitting: Family Medicine

## 2016-02-27 DIAGNOSIS — Z209 Contact with and (suspected) exposure to unspecified communicable disease: Secondary | ICD-10-CM

## 2016-02-27 NOTE — Telephone Encounter (Signed)
We can take care of these

## 2016-02-27 NOTE — Telephone Encounter (Signed)
They were not done, so I will put in orders for her and her husband to come by the lab to have them drawn.

## 2016-03-01 ENCOUNTER — Telehealth: Payer: Self-pay | Admitting: Family Medicine

## 2016-03-01 NOTE — Telephone Encounter (Signed)
lmom for pt to call to schedule appointment. °

## 2016-03-01 NOTE — Telephone Encounter (Signed)
Looks like pt needs lab appointment.

## 2016-03-01 NOTE — Telephone Encounter (Signed)
Pt would like to have lab orders for Hbsantigen and HIV test due to her going through adoption process.

## 2016-03-01 NOTE — Telephone Encounter (Signed)
Ordered these last week

## 2016-03-02 ENCOUNTER — Encounter: Payer: Self-pay | Admitting: Vascular Surgery

## 2016-03-04 ENCOUNTER — Other Ambulatory Visit (INDEPENDENT_AMBULATORY_CARE_PROVIDER_SITE_OTHER): Payer: 59

## 2016-03-04 DIAGNOSIS — Z209 Contact with and (suspected) exposure to unspecified communicable disease: Secondary | ICD-10-CM

## 2016-03-04 DIAGNOSIS — L821 Other seborrheic keratosis: Secondary | ICD-10-CM | POA: Diagnosis not present

## 2016-03-04 DIAGNOSIS — D1801 Hemangioma of skin and subcutaneous tissue: Secondary | ICD-10-CM | POA: Diagnosis not present

## 2016-03-04 DIAGNOSIS — L814 Other melanin hyperpigmentation: Secondary | ICD-10-CM | POA: Diagnosis not present

## 2016-03-04 NOTE — Telephone Encounter (Signed)
[name] pt called to let you know to hold off on getting that pw that she dropped off today until she hears back from the agency.

## 2016-03-04 NOTE — Telephone Encounter (Signed)
[name] pt calling back and state that you all could go forward with filling out the paperwork she has heard back from the agency and it is the correct paperwork.

## 2016-03-04 NOTE — Telephone Encounter (Signed)
Pt scheduled  

## 2016-03-05 ENCOUNTER — Encounter: Payer: Self-pay | Admitting: Vascular Surgery

## 2016-03-05 LAB — HIV ANTIBODY (ROUTINE TESTING W REFLEX): HIV 1&2 Ab, 4th Generation: NONREACTIVE

## 2016-03-05 LAB — HEPATITIS B SURFACE ANTIGEN: Hepatitis B Surface Ag: NEGATIVE

## 2016-03-05 NOTE — Telephone Encounter (Signed)
noted 

## 2016-03-08 ENCOUNTER — Other Ambulatory Visit: Payer: Self-pay | Admitting: Family Medicine

## 2016-03-09 ENCOUNTER — Encounter: Payer: Self-pay | Admitting: Family Medicine

## 2016-03-09 NOTE — Telephone Encounter (Signed)
We made the appropriate changes to the forms

## 2016-03-09 NOTE — Telephone Encounter (Signed)
Can we refill this? 

## 2016-03-17 ENCOUNTER — Ambulatory Visit (INDEPENDENT_AMBULATORY_CARE_PROVIDER_SITE_OTHER): Payer: 59 | Admitting: Vascular Surgery

## 2016-03-17 ENCOUNTER — Encounter: Payer: Self-pay | Admitting: Vascular Surgery

## 2016-03-17 VITALS — BP 114/74 | HR 102 | Temp 97.9°F | Resp 18 | Ht 60.0 in | Wt 106.3 lb

## 2016-03-17 DIAGNOSIS — I6523 Occlusion and stenosis of bilateral carotid arteries: Secondary | ICD-10-CM | POA: Diagnosis not present

## 2016-03-17 NOTE — Progress Notes (Signed)
Patient name: [name] MRN: [id number] DOB: 07/01/1975 Sex: female  REASON FOR VISIT: Follow up of a carotid dissection.   HPI: [name] is a 41 y.o. female who had presented in January 2015 with transient loss of vision in the right eye and expressive aphasia. MRI showed a left internal carotid artery dissection. She was treated with Coumadin. On her most recent follow up visit in June 2016 she had some mildly elevated velocities in the distal internal carotid arteries bilaterally but no focal stenosis. I felt that she could potentially have some mild fibromuscular dysplasia of the distal right internal carotid artery. Coumadin had long been discontinued and she was on aspirin at that time. She comes in for a follow up visit.  Since I saw her last she's been doing well although she does describe occasional episodes of left arm paresthesias which last 1-2 hours. This last occurred 1 week ago. She states that it occurs every 3 months or sometimes significantly longer. She also describes occasional paresthesias in her right leg. The most recent episode was in December 2017. This also lasted 1-2 hours.  She denies any expressive or receptive aphasia or amaurosis fugax.  She remains on aspirin. She is not on a statin. There've been no other significant changes to her medical history.   Past Medical History:  Diagnosis Date  . Allergy   . Asthma    sees Dr. Harold Hedge   . Chronic headaches   . Endometriosis   . Fibromuscular dysplasia of cervicocranial artery ([place])   . Hypoglycemia   . Internal carotid artery dissection [facility])    saw Dr. Gae Gallop   . Osteoporosis   . Paresthesia of left upper limb    saw Dr. Eddie North   . Stroke [facility])    age 23 dissection of left carotid artery    Family History  Problem Relation Age of Onset  . Diabetes Father   . Heart disease Father   . Hypertension Father   . Varicose Veins Father   . Bleeding Disorder Mother   . Varicose Veins  Mother   . Hypertension Mother     SOCIAL HISTORY: Social History  Substance Use Topics  . Smoking status: Never Smoker  . Smokeless tobacco: Never Used  . Alcohol use 0.0 oz/week     Comment: rare    Allergies  Allergen Reactions  . Codeine Anaphylaxis    Ears swelled up, patient develops severe blisters which cause sloughing of the skin  . Cefdinir     REACTION: diarrhea  . Cefuroxime Axetil     REACTION: diarrhea  . Red Dye Hives  . Strawberry Extract Hives    Current Outpatient Prescriptions  Medication Sig Dispense Refill  . aspirin 81 MG tablet Take 81 mg by mouth daily.    Marland Kitchen b complex vitamins tablet Take 1 tablet by mouth daily.    . Cholecalciferol (D3 SUPER STRENGTH) 2000 UNITS CAPS Take 1 capsule by mouth daily.    . Dapsone (ACZONE) 5 % topical gel Apply 1 application topically 2 (two) times daily as needed (acne).     . fexofenadine-pseudoephedrine (ALLEGRA-D) 60-120 MG per tablet Take 1 tablet by mouth daily. 12 hour    . ibuprofen (ADVIL) 200 MG tablet Take 200 mg by mouth as needed.    Marland Kitchen KLOR-CON M10 10 MEQ tablet TAKE ONE TABLET BY MOUTH TWICE DAILY**MAX 30 DAYS SUPPLY** 180 tablet 3  . Lactobacillus (PROBIOTIC ACIDOPHILUS PO) Take  1 capsule by mouth daily.    Marland Kitchen levalbuterol (XOPENEX HFA) 45 MCG/ACT inhaler Inhale 1-2 puffs into the lungs every 4 (four) hours as needed for wheezing or shortness of breath. Reported on 02/18/2015    . Magnesium 250 MG TABS Take 1 tablet by mouth daily.     . Multiple Vitamins-Minerals (MULTIVITAMIN,TX-MINERALS) tablet Take 1 tablet by mouth daily.      . niacin 500 MG tablet Take 500 mg by mouth at bedtime.     . Olopatadine HCl (PATANASE NA) Place 2 sprays into the nose 2 (two) times daily.    . potassium chloride (K-DUR,KLOR-CON) 10 MEQ tablet TAKE ONE TABLET BY MOUTH TWICE DAILY 180 tablet 3  . promethazine (PHENERGAN) 25 MG tablet Take 1 tablet (25 mg total) by mouth every 4 (four) hours as needed for nausea or vomiting. 60  tablet 2  . tretinoin (RETIN-A) 0.025 % cream Apply 1 application topically at bedtime.    . benzonatate (TESSALON) 200 MG capsule Take 1 capsule (200 mg total) by mouth 3 (three) times daily as needed. (Patient not taking: Reported on 03/17/2016) 30 capsule 0  . Sulfacetamide Sodium-Sulfur (SULFACETAMIDE SOD-SULFUR EX) Apply 1 application topically daily.      No current facility-administered medications for this visit.     REVIEW OF SYSTEMS:  [X]  denotes positive finding, [ ]  denotes negative finding Cardiac  Comments:  Chest pain or chest pressure:    Shortness of breath upon exertion:    Short of breath when lying flat:    Irregular heart rhythm:        Vascular    Pain in calf, thigh, or hip brought on by ambulation:    Pain in feet at night that wakes you up from your sleep:     Blood clot in your veins:    Leg swelling:         Pulmonary    Oxygen at home:    Productive cough:     Wheezing:         Neurologic    Sudden weakness in arms or legs:     Sudden numbness in arms or legs:  X   Sudden onset of difficulty speaking or slurred speech:    Temporary loss of vision in one eye:     Problems with dizziness:         Gastrointestinal    Blood in stool:     Vomited blood:         Genitourinary    Burning when urinating:     Blood in urine:        Psychiatric    Major depression:         Hematologic    Bleeding problems:    Problems with blood clotting too easily:        Skin    Rashes or ulcers:        Constitutional    Fever or chills:      PHYSICAL EXAM: Vitals:   03/17/16 1600  BP: 114/74  Pulse: (!) 102  Resp: 18  Temp: 97.9 F (36.6 C)  TempSrc: Oral  SpO2: 98%  Weight: 106 lb 4.8 oz (48.2 kg)  Height: 5' (1.524 m)    GENERAL: The patient is a well-nourished female, in no acute distress. The vital signs are documented above. CARDIAC: There is a regular rate and rhythm.  VASCULAR: I do not detect carotid bruits. PULMONARY: There is good  air exchange bilaterally without wheezing  or rales. ABDOMEN: Soft and non-tender with normal pitched bowel sounds.  MUSCULOSKELETAL: There are no major deformities or cyanosis. NEUROLOGIC: No focal weakness or paresthesias are detected. SKIN: There are no ulcers or rashes noted. PSYCHIATRIC: The patient has a normal affect.  DATA:   She did not have a carotid duplex scan today and so she is coming back for a carotid duplex scan.  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 I've set her up for a carotid duplex scan and we'll see her back at that time. If this scan is unremarkable the only other consideration to workup her transient left arm and right leg paresthesias would be an MRA to evaluate the distal circulation. She does continue taking her aspirin. Will make further recommendations pinning the results of her carotid duplex scan.  Deitra Mayo [facility] [place] [phone]

## 2016-03-19 NOTE — Addendum Note (Signed)
Addended by: Lianne Cure A on: 03/19/2016 11:57 AM   Modules accepted: Orders

## 2016-03-23 ENCOUNTER — Other Ambulatory Visit: Payer: Self-pay | Admitting: Obstetrics and Gynecology

## 2016-04-12 ENCOUNTER — Encounter: Payer: Self-pay | Admitting: Vascular Surgery

## 2016-04-15 ENCOUNTER — Ambulatory Visit (HOSPITAL_COMMUNITY)
Admission: RE | Admit: 2016-04-15 | Discharge: 2016-04-15 | Disposition: A | Discharge status: Home / Self Care | Payer: 59 | Source: Ambulatory Visit | Attending: Vascular Surgery | Admitting: Vascular Surgery

## 2016-04-15 DIAGNOSIS — I6523 Occlusion and stenosis of bilateral carotid arteries: Secondary | ICD-10-CM

## 2016-04-20 DIAGNOSIS — J3089 Other allergic rhinitis: Secondary | ICD-10-CM | POA: Diagnosis not present

## 2016-04-20 DIAGNOSIS — J4520 Mild intermittent asthma, uncomplicated: Secondary | ICD-10-CM | POA: Diagnosis not present

## 2016-04-20 DIAGNOSIS — J301 Allergic rhinitis due to pollen: Secondary | ICD-10-CM | POA: Diagnosis not present

## 2016-04-21 ENCOUNTER — Ambulatory Visit (INDEPENDENT_AMBULATORY_CARE_PROVIDER_SITE_OTHER): Payer: 59 | Admitting: Vascular Surgery

## 2016-04-21 ENCOUNTER — Encounter: Payer: Self-pay | Admitting: Vascular Surgery

## 2016-04-21 VITALS — BP 125/80 | HR 110 | Temp 98.6°F | Resp 16 | Ht 60.0 in | Wt 105.0 lb

## 2016-04-21 DIAGNOSIS — I6523 Occlusion and stenosis of bilateral carotid arteries: Secondary | ICD-10-CM | POA: Diagnosis not present

## 2016-04-21 NOTE — Progress Notes (Signed)
Patient name: [name] MRN: [id number] DOB: Mar 01, 1975 Sex: female  REASON FOR VISIT: Follow up  HPI: [name] is a 41 y.o. female who I last saw on 03/17/2016. The patient presented in 2015 with a left internal carotid artery dissection. She was treated with Coumadin. On subsequent follow up duplex scans I had some concern that she might have fibromuscular dysplasia of the distal right internal carotid artery. When I saw her in March, she describes some occasional episodes of left arm paresthesias which last 1-2 hours. She also describes occasional paresthesias in her right leg. She was on aspirin.   She comes in for discussion of the results of her carotid duplex which was done since her last visit. She has had no new symptoms since her last visit. She does have some history of low back pain and occasional neck pain. She is on aspirin.  Past Medical History:  Diagnosis Date  . Allergy   . Asthma    sees Dr. Harold Hedge   . Chronic headaches   . Endometriosis   . Fibromuscular dysplasia of cervicocranial artery ([place])   . Hypoglycemia   . Internal carotid artery dissection [facility])    saw Dr. Gae Gallop   . Osteoporosis   . Paresthesia of left upper limb    saw Dr. Eddie North   . Stroke [facility])    age 61 dissection of left carotid artery    Family History  Problem Relation Age of Onset  . Diabetes Father   . Heart disease Father   . Hypertension Father   . Varicose Veins Father   . Bleeding Disorder Mother   . Varicose Veins Mother   . Hypertension Mother     SOCIAL HISTORY: Social History  Substance Use Topics  . Smoking status: Never Smoker  . Smokeless tobacco: Never Used  . Alcohol use 0.0 oz/week     Comment: rare    Allergies  Allergen Reactions  . Codeine Anaphylaxis    Ears swelled up, patient develops severe blisters which cause sloughing of the skin  . Cefdinir     REACTION: diarrhea  . Cefuroxime Axetil     REACTION: diarrhea  . Red Dye Hives    . Strawberry Extract Hives    Current Outpatient Prescriptions  Medication Sig Dispense Refill  . aspirin 81 MG tablet Take 81 mg by mouth daily.    Marland Kitchen b complex vitamins tablet Take 1 tablet by mouth daily.    . benzonatate (TESSALON) 200 MG capsule Take 1 capsule (200 mg total) by mouth 3 (three) times daily as needed. 30 capsule 0  . Cholecalciferol (D3 SUPER STRENGTH) 2000 UNITS CAPS Take 1 capsule by mouth daily.    . Dapsone (ACZONE) 5 % topical gel Apply 1 application topically 2 (two) times daily as needed (acne).     . fexofenadine-pseudoephedrine (ALLEGRA-D) 60-120 MG per tablet Take 1 tablet by mouth daily. 12 hour    . ibuprofen (ADVIL) 200 MG tablet Take 200 mg by mouth as needed.    Marland Kitchen KLOR-CON M10 10 MEQ tablet TAKE ONE TABLET BY MOUTH TWICE DAILY**MAX 30 DAYS SUPPLY** 180 tablet 3  . Lactobacillus (PROBIOTIC ACIDOPHILUS PO) Take 1 capsule by mouth daily.    Marland Kitchen levalbuterol (XOPENEX HFA) 45 MCG/ACT inhaler Inhale 1-2 puffs into the lungs every 4 (four) hours as needed for wheezing or shortness of breath. Reported on 02/18/2015    . Magnesium 250 MG TABS Take 1 tablet by  mouth daily.     . Multiple Vitamins-Minerals (MULTIVITAMIN,TX-MINERALS) tablet Take 1 tablet by mouth daily.      . niacin 500 MG tablet Take 500 mg by mouth at bedtime.     . Olopatadine HCl (PATANASE NA) Place 2 sprays into the nose 2 (two) times daily.    . potassium chloride (K-DUR,KLOR-CON) 10 MEQ tablet TAKE ONE TABLET BY MOUTH TWICE DAILY 180 tablet 3  . promethazine (PHENERGAN) 25 MG tablet Take 1 tablet (25 mg total) by mouth every 4 (four) hours as needed for nausea or vomiting. 60 tablet 2  . Sulfacetamide Sodium-Sulfur (SULFACETAMIDE SOD-SULFUR EX) Apply 1 application topically daily.     Marland Kitchen tretinoin (RETIN-A) 0.025 % cream Apply 1 application topically at bedtime.     No current facility-administered medications for this visit.     REVIEW OF SYSTEMS:  [X]  denotes positive finding, [ ]  denotes  negative finding Cardiac  Comments:  Chest pain or chest pressure:    Shortness of breath upon exertion:    Short of breath when lying flat:    Irregular heart rhythm:        Vascular    Pain in calf, thigh, or hip brought on by ambulation:    Pain in feet at night that wakes you up from your sleep:     Blood clot in your veins:    Leg swelling:         Pulmonary    Oxygen at home:    Productive cough:     Wheezing:         Neurologic    Sudden weakness in arms or legs:     Sudden numbness in arms or legs:  X   Sudden onset of difficulty speaking or slurred speech:    Temporary loss of vision in one eye:     Problems with dizziness:         Gastrointestinal    Blood in stool:     Vomited blood:         Genitourinary    Burning when urinating:     Blood in urine:        Psychiatric    Major depression:         Hematologic    Bleeding problems:    Problems with blood clotting too easily:        Skin    Rashes or ulcers:        Constitutional    Fever or chills:      PHYSICAL EXAM: Vitals:   04/21/16 0910 04/21/16 0913  BP: 124/80 125/80  Pulse: (!) 110   Resp: 16   Temp: 98.6 F (37 C)   TempSrc: Oral   SpO2: 95%   Weight: 105 lb (47.6 kg)   Height: 5' (1.524 m)     GENERAL: The patient is a well-nourished female, in no acute distress. The vital signs are documented above. CARDIAC: There is a regular rate and rhythm.  VASCULAR: I do not detect carotid bruits. PULMONARY: There is good air exchange bilaterally without wheezing or rales. MUSCULOSKELETAL: There are no major deformities or cyanosis. NEUROLOGIC: No focal weakness or paresthesias are detected. SKIN: There are no ulcers or rashes noted. PSYCHIATRIC: The patient has a normal affect.  DATA:   CAROTID DUPLEX: I have reviewed her carotid duplex scan which was done on 04/15/2016. There was no evidence of fibromuscular dysplasia in either carotid artery. There was no significant carotid  stenosis on either  side. The previous carotid dissection could not be seen.  MEDICAL ISSUES:  HISTORY OF LEFT CAROTID DISSECTION: Her carotid duplex scan today is unremarkable. There is no evidence of fibromuscular dysplasia. She will continue taking her aspirin. I ordered a follow up duplex scan in 2 years and also her back at that time. She is to call sooner if she has problems.    Deitra Mayo [facility] [place] [phone]

## 2016-04-22 ENCOUNTER — Encounter (HOSPITAL_COMMUNITY): Payer: 59

## 2016-04-28 ENCOUNTER — Ambulatory Visit: Payer: 59 | Admitting: Vascular Surgery

## 2016-04-28 ENCOUNTER — Encounter: Payer: Self-pay | Admitting: Vascular Surgery

## 2016-04-30 ENCOUNTER — Encounter: Payer: Self-pay | Admitting: Family Medicine

## 2016-05-04 NOTE — Telephone Encounter (Signed)
The letter is ready  

## 2016-05-25 NOTE — H&P (Signed)
[name] is a 41 y.o. female G:0 with a history of endometriosis who presents for a left ovarian cystectomy because of a recurrent and persistent endometrioma and chronic pelvic pain.  For two months following  laparoscopic removal of bilateral ovarian cysts in 2016 (endometrioma)  the patient  had relief from pelvic pain but after that time the pain returned and gradually worsened.  The pain is described as random, "doubling over"  and occurring with and without menses.  Since January 2018,  the patient has had problems walking due to the pain.  Her menstrual flow lasts 3-7 days with a pad change every 2 hours (occasionally will have clots).  During her menses, cramping is rated as 10/10 on a 10 point pain scale with a decrease to 5/10 with Ibuprofen 600 mg.  She admits to loose stools with menses and lower back pain  but denies other changes in bowel/bladder function, dyspareunia or inter-menstrual bleeding.  A pelvic ultrasound in November 2017 showed: retroverted uterus: 3.27 x 4.53 x 3.19 cm, endometrium: 0.65 cm; #2 intramural fibroids: anterior: 1.56 cm and posterior: 0.90 cm; right ovary: 3.11 cm and left ovary: 4.29 cm with a 2.7 cm endometrioma-normal Dopplers.  A follow up ultrasound in February 2018 showed the endometrioma increased to 2.8 cm.  A review of medical managment options were given to the patient, to include Lupron Depot, along with surgical options however, the patient wants to proceed with surgical management in the form of left oophorectomy.   Past Medical History  OB History: G: 0  GYN History: menarche: 41 YO    LMP: 05/15/2016    Contracepton no method  The patient denies history of sexually transmitted disease.  Denies history of abnormal PAP smear.  Last PAP smear: 2017, normal with negative HPV  Medical History: Osteoporosis, Endometriosis, Transient Ischemic Attack, Fibromuscular Dysplasia and Acne  Surgical History:  1990 Appendectomy; 2015 Hysteroscopy D & C for  irregular bleeding; 2016 Laparoscopic Bilateral Ovarian Cystectomy Denies problems with anesthesia or history of blood transfusions  Family History: Osteoporosis, Hypertension, Thyroid Disease, Diabetes Mellitus and Heart Disease  Social History:  Married and employed in [employer];  Denies tobacco use and rarely consumes alcohol   Medications:  Aspirin 81 mg daily Aczone 5% Topical Gel daily Tretinoin 0.025% Topical Cream daily Levalbuterol HFA 45 mcg 2 puffs every 4-6 hours prn Klor Con 10 mEq daily Azelasine 137 mcg nasal spray daily Allegra-D 12 hours daily as directed prn Promethazine 25 mg prn with travel Olopatadine 0.6% nasal spray as directed  Allergies  Allergen Reactions  . Codeine Anaphylaxis    Ears swelled up, patient develops severe blisters which cause sloughing of the skin  . Cefdinir     REACTION: diarrhea  . Cefuroxime Axetil     REACTION: diarrhea  . Red Dye Hives  . Strawberry Extract Hives    Denies sensitivity to peanuts, shellfish, soy, latex or adhesives.   ROS: Admits to glasses and "year round" allergies;   denies headache, vision changes,  dysphagia, tinnitus, dizziness, hoarseness, cough,  chest pain, nausea, vomiting, diarrhea,constipation,  urinary frequency, urgency  dysuria, hematuria, vaginitis symptoms,  swelling of joints,easy bruising,  myalgias, arthralgias, skin rashes, unexplained weight loss and except as is mentioned in the historyf present illness, patient's review of systems is otherwise negative.   Physical Exam  Bp: 110/74    P: 88 bpm   R:16   Temperature:  98.6 degrees F orally  Weight:  106  lbs.  Height: 5'  BMI:20.7  Neck: supple without masses or thyromegaly Lungs: clear to auscultation Heart: regular rate and rhythm Abdomen: soft, diffusely tender  without guarding and no organomegaly Pelvic:EGBUS- wnl; vagina-normal rugae; uterus-normal size, cervix without lesions or motion tenderness; adnexae-bilateral   tenderness or masses Extremities:  no clubbing, cyanosis or edema   Assesment: Recurrent and Persistent Left Endometrioma                      Chronic Pelvic Pain   Disposition:  Reviewed the risks of surgery to include, but not limited to: reaction to anesthesia, damage to adjacent organs, infection and excessive bleeding. The patient verbalized understanding of these risks and has consented to proceed with a Laparoscopic Left Salpingo-Oophorectomy at [facility] on June 10, 2016.   CSN# [id number]   [place] J. Florene Glen, PA-C  for Dr. Harvie Bridge. Mancel Bale

## 2016-06-01 NOTE — Patient Instructions (Signed)
Your procedure is scheduled on:  Thursday, June 10, 2016  Enter through the [employer] of [facility] at:  7:00 AM  Pick up the phone at the desk and dial [phone].  Call this number if you have problems the morning of surgery: [phone].  Remember: Do NOT eat food or drink after:  Midnight Wednesday  Take these medicines the morning of surgery with a SIP OF WATER:  Potassium, Use Nasal spray per normal routine  Bring Asthma Inhaler day of surgery  Stop ALL herbal medications at this time  Do NOT smoke the day of surgery.  Do NOT wear jewelry (body piercing), metal hair clips/bobby pins, make-up, or nail polish. Do NOT wear lotions, powders, or perfumes.  You may wear deodorant. Do NOT shave for 48 hours prior to surgery. Do NOT bring valuables to the hospital. Contacts, dentures, or bridgework may not be worn into surgery.  Have a responsible adult drive you home and stay with you for 24 hours after your procedure  Bring a copy of your healthcare power of attorney and living will documents.

## 2016-06-02 ENCOUNTER — Other Ambulatory Visit: Payer: Self-pay

## 2016-06-02 ENCOUNTER — Encounter (HOSPITAL_COMMUNITY)
Admission: RE | Admit: 2016-06-02 | Discharge: 2016-06-02 | Disposition: A | Discharge status: Home / Self Care | Payer: 59 | Source: Ambulatory Visit | Attending: Obstetrics and Gynecology | Admitting: Obstetrics and Gynecology

## 2016-06-02 ENCOUNTER — Encounter (HOSPITAL_COMMUNITY): Payer: Self-pay

## 2016-06-02 DIAGNOSIS — Z01812 Encounter for preprocedural laboratory examination: Secondary | ICD-10-CM | POA: Insufficient documentation

## 2016-06-02 DIAGNOSIS — Z01810 Encounter for preprocedural cardiovascular examination: Secondary | ICD-10-CM | POA: Diagnosis not present

## 2016-06-02 DIAGNOSIS — Z01818 Encounter for other preprocedural examination: Secondary | ICD-10-CM | POA: Diagnosis not present

## 2016-06-02 HISTORY — DX: Other specified postprocedural states: R11.2

## 2016-06-02 HISTORY — DX: Other specified postprocedural states: Z98.890

## 2016-06-02 LAB — BASIC METABOLIC PANEL
Anion gap: 7 (ref 5–15)
BUN: 15 mg/dL (ref 6–20)
CALCIUM: 9.5 mg/dL (ref 8.9–10.3)
CHLORIDE: 104 mmol/L (ref 101–111)
CO2: 28 mmol/L (ref 22–32)
CREATININE: 0.6 mg/dL (ref 0.44–1.00)
GFR calc Af Amer: >60 mL/min (ref >60)
GFR calc non Af Amer: >60 mL/min (ref >60)
Glucose, Bld: 86 mg/dL (ref 65–99)
Potassium: 3.7 mmol/L (ref 3.5–5.1)
SODIUM: 139 mmol/L (ref 135–145)

## 2016-06-02 LAB — CBC
HCT: 39.1 % (ref 36.0–46.0)
Hemoglobin: 13.5 g/dL (ref 12.0–15.0)
MCH: 31.5 pg (ref 26.0–34.0)
MCHC: 34.5 g/dL (ref 30.0–36.0)
MCV: 91.1 fL (ref 78.0–100.0)
PLATELETS: 214 10*3/uL (ref 150–400)
RBC: 4.29 MIL/uL (ref 3.87–5.11)
RDW: 12.8 % (ref 11.5–15.5)
WBC: 6.3 10*3/uL (ref 4.0–10.5)

## 2016-06-02 NOTE — Pre-Procedure Instructions (Signed)
Reviewed medical history with Dr. Sabra Heck, no medical clearance or new orders requested at this time.

## 2016-06-09 NOTE — Anesthesia Preprocedure Evaluation (Addendum)
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 Gae Gallop, no significant carotid plaque bilaterally   TIA (Associated with fibromuscular dysplasia and Left ICA dissection1/2015, no residual present)negative neurological ROS     GI/Hepatic negative GI ROS, Neg liver ROS,   Endo/Other  negative endocrine ROS  Renal/GU negative Renal ROS   Bilateral ovarian endometriomas    Musculoskeletal negative musculoskeletal ROS (+)   Abdominal (+)  Abdomen: soft.    Peds  Hematology 13/38   Anesthesia Other Findings Fibromuscular dysplasia hcg negative  Reproductive/Obstetrics                           Anesthesia Physical  Anesthesia Plan  ASA: II  Anesthesia Plan: General   Post-op Pain Management:    Induction: Intravenous  PONV Risk Score and Plan: 4 or greater and Ondansetron, Dexamethasone, Propofol, Midazolam, Scopolamine patch - Pre-op and Treatment may vary due to age  Airway Management Planned: Oral ETT  Additional Equipment:   Intra-op Plan:   Post-operative Plan: Extubation in OR  Informed Consent: I have reviewed the patients History and Physical, chart, labs and discussed the procedure including the risks, benefits and alternatives for the proposed anesthesia with the patient or  authorized representative who has indicated his/her understanding and acceptance.   Dental advisory given  Plan Discussed with: CRNA  Anesthesia Plan Comments:         Anesthesia Quick Evaluation

## 2016-06-10 ENCOUNTER — Encounter (HOSPITAL_COMMUNITY): Payer: Self-pay

## 2016-06-10 ENCOUNTER — Ambulatory Visit (HOSPITAL_COMMUNITY)
Admission: RE | Admit: 2016-06-10 | Discharge: 2016-06-10 | Disposition: A | Discharge status: Home / Self Care | Payer: 59 | Source: Ambulatory Visit | Attending: Obstetrics and Gynecology | Admitting: Obstetrics and Gynecology

## 2016-06-10 ENCOUNTER — Encounter (HOSPITAL_COMMUNITY)
Admission: RE | Disposition: A | Discharge status: Home / Self Care | Payer: Self-pay | Source: Ambulatory Visit | Attending: Obstetrics and Gynecology

## 2016-06-10 ENCOUNTER — Ambulatory Visit (HOSPITAL_COMMUNITY): Payer: 59 | Admitting: Anesthesiology

## 2016-06-10 DIAGNOSIS — N838 Other noninflammatory disorders of ovary, fallopian tube and broad ligament: Secondary | ICD-10-CM | POA: Insufficient documentation

## 2016-06-10 DIAGNOSIS — R102 Pelvic and perineal pain: Secondary | ICD-10-CM | POA: Insufficient documentation

## 2016-06-10 DIAGNOSIS — Z8349 Family history of other endocrine, nutritional and metabolic diseases: Secondary | ICD-10-CM | POA: Insufficient documentation

## 2016-06-10 DIAGNOSIS — Z7951 Long term (current) use of inhaled steroids: Secondary | ICD-10-CM | POA: Insufficient documentation

## 2016-06-10 DIAGNOSIS — L709 Acne, unspecified: Secondary | ICD-10-CM | POA: Diagnosis not present

## 2016-06-10 DIAGNOSIS — M810 Age-related osteoporosis without current pathological fracture: Secondary | ICD-10-CM | POA: Diagnosis not present

## 2016-06-10 DIAGNOSIS — N809 Endometriosis, unspecified: Secondary | ICD-10-CM | POA: Diagnosis not present

## 2016-06-10 DIAGNOSIS — Z881 Allergy status to other antibiotic agents status: Secondary | ICD-10-CM | POA: Insufficient documentation

## 2016-06-10 DIAGNOSIS — Z7982 Long term (current) use of aspirin: Secondary | ICD-10-CM | POA: Insufficient documentation

## 2016-06-10 DIAGNOSIS — Z9049 Acquired absence of other specified parts of digestive tract: Secondary | ICD-10-CM | POA: Diagnosis not present

## 2016-06-10 DIAGNOSIS — Z87892 Personal history of anaphylaxis: Secondary | ICD-10-CM | POA: Insufficient documentation

## 2016-06-10 DIAGNOSIS — Z885 Allergy status to narcotic agent status: Secondary | ICD-10-CM | POA: Diagnosis not present

## 2016-06-10 DIAGNOSIS — R1032 Left lower quadrant pain: Secondary | ICD-10-CM | POA: Diagnosis not present

## 2016-06-10 DIAGNOSIS — Z8673 Personal history of transient ischemic attack (TIA), and cerebral infarction without residual deficits: Secondary | ICD-10-CM | POA: Insufficient documentation

## 2016-06-10 DIAGNOSIS — Z833 Family history of diabetes mellitus: Secondary | ICD-10-CM | POA: Insufficient documentation

## 2016-06-10 DIAGNOSIS — Z79899 Other long term (current) drug therapy: Secondary | ICD-10-CM | POA: Insufficient documentation

## 2016-06-10 DIAGNOSIS — N80129 Deep endometriosis of ovary, unspecified ovary: Secondary | ICD-10-CM

## 2016-06-10 DIAGNOSIS — Z9102 Food additives allergy status: Secondary | ICD-10-CM | POA: Insufficient documentation

## 2016-06-10 DIAGNOSIS — Z9889 Other specified postprocedural states: Secondary | ICD-10-CM | POA: Diagnosis not present

## 2016-06-10 DIAGNOSIS — Z91048 Other nonmedicinal substance allergy status: Secondary | ICD-10-CM | POA: Diagnosis not present

## 2016-06-10 DIAGNOSIS — N83202 Unspecified ovarian cyst, left side: Secondary | ICD-10-CM | POA: Diagnosis not present

## 2016-06-10 DIAGNOSIS — Z8249 Family history of ischemic heart disease and other diseases of the circulatory system: Secondary | ICD-10-CM | POA: Diagnosis not present

## 2016-06-10 DIAGNOSIS — N801 Endometriosis of ovary: Secondary | ICD-10-CM

## 2016-06-10 DIAGNOSIS — G8929 Other chronic pain: Secondary | ICD-10-CM

## 2016-06-10 HISTORY — PX: LAPAROSCOPIC OOPHERECTOMY: SHX6507

## 2016-06-10 LAB — PREGNANCY, URINE: Preg Test, Ur: NEGATIVE

## 2016-06-10 SURGERY — OOPHORECTOMY, LAPAROSCOPIC
Anesthesia: General | Site: Abdomen | Laterality: Left

## 2016-06-10 MED ORDER — KETOROLAC TROMETHAMINE 30 MG/ML IJ SOLN
INTRAMUSCULAR | Status: AC
  Filled 2016-06-10: qty 1

## 2016-06-10 MED ORDER — DEXAMETHASONE SODIUM PHOSPHATE 10 MG/ML IJ SOLN
INTRAMUSCULAR | Status: AC
  Filled 2016-06-10: qty 1

## 2016-06-10 MED ORDER — PROPOFOL 10 MG/ML IV BOLUS
INTRAVENOUS | Status: AC
  Filled 2016-06-10: qty 20

## 2016-06-10 MED ORDER — FAMOTIDINE 20 MG PO TABS
ORAL_TABLET | ORAL | Status: AC
Start: 2016-06-10 — End: 2016-06-10
  Administered 2016-06-10: 20 mg via ORAL
  Filled 2016-06-10: qty 1

## 2016-06-10 MED ORDER — FENTANYL CITRATE (PF) 250 MCG/5ML IJ SOLN
INTRAMUSCULAR | Status: AC
  Filled 2016-06-10: qty 5

## 2016-06-10 MED ORDER — LIDOCAINE 2% (20 MG/ML) 5 ML SYRINGE
INTRAMUSCULAR | Status: DC | PRN
Start: 2016-06-10 — End: 2016-06-10
  Administered 2016-06-10: 40 mg via INTRAVENOUS

## 2016-06-10 MED ORDER — ONDANSETRON HCL 4 MG/2ML IJ SOLN
INTRAMUSCULAR | Status: AC
  Filled 2016-06-10: qty 2

## 2016-06-10 MED ORDER — BUPIVACAINE HCL (PF) 0.25 % IJ SOLN
INTRAMUSCULAR | Status: AC
  Filled 2016-06-10: qty 30

## 2016-06-10 MED ORDER — LIDOCAINE HCL (CARDIAC) 20 MG/ML IV SOLN
INTRAVENOUS | Status: AC
  Filled 2016-06-10: qty 5

## 2016-06-10 MED ORDER — DEXAMETHASONE SODIUM PHOSPHATE 10 MG/ML IJ SOLN
INTRAMUSCULAR | Status: DC | PRN
  Administered 2016-06-10: 5 mg via INTRAVENOUS

## 2016-06-10 MED ORDER — SUGAMMADEX SODIUM 200 MG/2ML IV SOLN
INTRAVENOUS | Status: DC | PRN
  Administered 2016-06-10: 200 mg via INTRAVENOUS

## 2016-06-10 MED ORDER — BUPIVACAINE HCL (PF) 0.25 % IJ SOLN
INTRAMUSCULAR | Status: DC | PRN
  Administered 2016-06-10: 17 mL

## 2016-06-10 MED ORDER — LACTATED RINGERS IR SOLN
Status: DC | PRN
  Administered 2016-06-10: 3000 mL

## 2016-06-10 MED ORDER — GENTAMICIN SULFATE 40 MG/ML IJ SOLN
INTRAVENOUS | Status: AC
  Administered 2016-06-10: 112 mL via INTRAVENOUS
  Filled 2016-06-10: qty 6

## 2016-06-10 MED ORDER — SCOPOLAMINE 1 MG/3DAYS TD PT72
1.0000 | MEDICATED_PATCH | Freq: Once | TRANSDERMAL | Status: DC
  Administered 2016-06-10: 1.5 mg via TRANSDERMAL

## 2016-06-10 MED ORDER — FENTANYL CITRATE (PF) 250 MCG/5ML IJ SOLN
INTRAMUSCULAR | Status: DC | PRN
  Administered 2016-06-10: 100 ug via INTRAVENOUS
  Administered 2016-06-10: 50 ug via INTRAVENOUS

## 2016-06-10 MED ORDER — SUGAMMADEX SODIUM 200 MG/2ML IV SOLN
INTRAVENOUS | Status: AC
  Filled 2016-06-10: qty 2

## 2016-06-10 MED ORDER — ROCURONIUM BROMIDE 10 MG/ML (PF) SYRINGE
PREFILLED_SYRINGE | INTRAVENOUS | Status: DC | PRN
  Administered 2016-06-10: 40 mg via INTRAVENOUS

## 2016-06-10 MED ORDER — PROPOFOL 10 MG/ML IV BOLUS
INTRAVENOUS | Status: DC | PRN
  Administered 2016-06-10: 150 mg via INTRAVENOUS

## 2016-06-10 MED ORDER — MIDAZOLAM HCL 2 MG/2ML IJ SOLN
INTRAMUSCULAR | Status: AC
  Filled 2016-06-10: qty 2

## 2016-06-10 MED ORDER — SCOPOLAMINE 1 MG/3DAYS TD PT72
MEDICATED_PATCH | TRANSDERMAL | Status: AC
  Administered 2016-06-10: 1.5 mg via TRANSDERMAL
  Filled 2016-06-10: qty 1

## 2016-06-10 MED ORDER — ONDANSETRON HCL 4 MG/2ML IJ SOLN
INTRAMUSCULAR | Status: DC | PRN
  Administered 2016-06-10: 4 mg via INTRAVENOUS

## 2016-06-10 MED ORDER — KETOROLAC TROMETHAMINE 30 MG/ML IJ SOLN
INTRAMUSCULAR | Status: DC | PRN
  Administered 2016-06-10: 30 mg via INTRAVENOUS

## 2016-06-10 MED ORDER — DEXAMETHASONE SODIUM PHOSPHATE 4 MG/ML IJ SOLN
INTRAMUSCULAR | Status: AC
Start: 2016-06-10 — End: 2016-06-10
  Filled 2016-06-10: qty 1

## 2016-06-10 MED ORDER — TRAMADOL HCL 50 MG PO TABS: ORAL_TABLET | ORAL | 0 refills | Status: DC

## 2016-06-10 MED ORDER — FAMOTIDINE 20 MG PO TABS
20.0000 mg | ORAL_TABLET | Freq: Once | ORAL | Status: AC
  Administered 2016-06-10: 20 mg via ORAL

## 2016-06-10 MED ORDER — ROCURONIUM BROMIDE 100 MG/10ML IV SOLN
INTRAVENOUS | Status: AC
  Filled 2016-06-10: qty 1

## 2016-06-10 MED ORDER — IBUPROFEN 600 MG PO TABS: ORAL_TABLET | ORAL | 1 refills | Status: DC

## 2016-06-10 MED ORDER — LACTATED RINGERS IV SOLN
INTRAVENOUS | Status: DC
  Administered 2016-06-10 (×2): via INTRAVENOUS

## 2016-06-10 MED ORDER — MIDAZOLAM HCL 5 MG/5ML IJ SOLN
INTRAMUSCULAR | Status: DC | PRN
  Administered 2016-06-10: 2 mg via INTRAVENOUS

## 2016-06-10 SURGICAL SUPPLY — 36 items
CABLE HIGH FREQUENCY MONO STRZ (ELECTRODE) IMPLANT
CLOTH BEACON ORANGE TIMEOUT ST (SAFETY) ×3 IMPLANT
DERMABOND ADVANCED (GAUZE/BANDAGES/DRESSINGS) ×2
DERMABOND ADVANCED .7 DNX12 (GAUZE/BANDAGES/DRESSINGS) ×1 IMPLANT
DRSG OPSITE POSTOP 3X4 (GAUZE/BANDAGES/DRESSINGS) ×3 IMPLANT
DURAPREP 26ML APPLICATOR (WOUND CARE) ×3 IMPLANT
FORCEPS CUTTING 33CM 5MM (CUTTING FORCEPS) IMPLANT
GLOVE BIO SURGEON STRL SZ7.5 (GLOVE) ×3 IMPLANT
GLOVE BIOGEL PI IND STRL 7.0 (GLOVE) ×2 IMPLANT
GLOVE BIOGEL PI IND STRL 7.5 (GLOVE) ×2 IMPLANT
GLOVE BIOGEL PI INDICATOR 7.0 (GLOVE) ×4
GLOVE BIOGEL PI INDICATOR 7.5 (GLOVE) ×4
GOWN STRL REUS W/TWL LRG LVL3 (GOWN DISPOSABLE) ×6 IMPLANT
HEMOSTAT SURGICEL 2X3 (HEMOSTASIS) ×3 IMPLANT
NEEDLE INSUFFLATION 120MM (ENDOMECHANICALS) ×3 IMPLANT
NS IRRIG 1000ML POUR BTL (IV SOLUTION) ×3 IMPLANT
PACK LAPAROSCOPY BASIN (CUSTOM PROCEDURE TRAY) ×3 IMPLANT
PACK TRENDGUARD 450 HYBRID PRO (MISCELLANEOUS) ×1 IMPLANT
PACK TRENDGUARD 600 HYBRD PROC (MISCELLANEOUS) IMPLANT
POUCH SPECIMEN RETRIEVAL 10MM (ENDOMECHANICALS) IMPLANT
PROTECTOR NERVE ULNAR (MISCELLANEOUS) ×6 IMPLANT
SET IRRIG TUBING LAPAROSCOPIC (IRRIGATION / IRRIGATOR) IMPLANT
SLEEVE XCEL OPT CAN 5 100 (ENDOMECHANICALS) ×3 IMPLANT
SOLUTION ELECTROLUBE (MISCELLANEOUS) IMPLANT
SUT MNCRL AB 3-0 PS2 27 (SUTURE) ×3 IMPLANT
SUT VICRYL 0 ENDOLOOP (SUTURE) ×3 IMPLANT
SUT VICRYL 0 TIES 12 18 (SUTURE) IMPLANT
SUT VICRYL 0 UR6 27IN ABS (SUTURE) ×6 IMPLANT
SYR 50ML LL SCALE MARK (SYRINGE) ×3 IMPLANT
TOWEL OR 17X24 6PK STRL BLUE (TOWEL DISPOSABLE) ×6 IMPLANT
TRAY FOLEY CATH SILVER 14FR (SET/KITS/TRAYS/PACK) ×3 IMPLANT
TRENDGUARD 450 HYBRID PRO PACK (MISCELLANEOUS) ×3
TRENDGUARD 600 HYBRID PROC PK (MISCELLANEOUS)
TROCAR XCEL NON-BLD 11X100MML (ENDOMECHANICALS) ×6 IMPLANT
TROCAR XCEL NON-BLD 5MMX100MML (ENDOMECHANICALS) ×3 IMPLANT
WARMER LAPAROSCOPE (MISCELLANEOUS) ×3 IMPLANT

## 2016-06-10 NOTE — OR Nursing (Signed)
Surgicel placed in pelvic cavity by Dr. Mancel Bale.

## 2016-06-10 NOTE — Interval H&P Note (Signed)
History and Physical Interval Note:  06/10/2016 8:27 AM  [name]  has presented today for surgery, with the diagnosis of Ovarian Cyst  The various methods of treatment have been discussed with the patient and family. After consideration of risks, benefits and other options for treatment, the patient has consented to  Procedure(s): LAPAROSCOPIC SALPINGO-OOPHORECTOMY (Left) POSSIBLE LAPAROTOMY as a surgical intervention .  The patient's history has been reviewed, patient examined, no change in status, stable for surgery.  I have reviewed the patient's chart and labs.  Questions were answered to the patient's satisfaction.     Delice Lesch

## 2016-06-10 NOTE — Anesthesia Procedure Notes (Signed)
Procedure Name: Intubation Date/Time: 06/10/2016 8:41 AM Performed by: Talbot Grumbling Pre-anesthesia Checklist: Patient identified, Emergency Drugs available, Suction available and Patient being monitored Patient Re-evaluated:Patient Re-evaluated prior to inductionOxygen Delivery Method: Circle system utilized Preoxygenation: Pre-oxygenation with 100% oxygen Intubation Type: IV induction Ventilation: Mask ventilation without difficulty Laryngoscope Size: Miller and 2 Grade View: Grade I Tube type: Oral Tube size: 7.0 mm Number of attempts: 1 Airway Equipment and Method: Stylet Placement Confirmation: positive ETCO2,  ETT inserted through vocal cords under direct vision and breath sounds checked- equal and bilateral Secured at: 21 cm Tube secured with: Tape Dental Injury: Teeth and Oropharynx as per pre-operative assessment

## 2016-06-10 NOTE — Anesthesia Postprocedure Evaluation (Signed)
Anesthesia Post Note  Patient: SALLY MENARD  Procedure(s) Performed: Procedure(s) (LRB): LAPAROSCOPIC OOPHORECTOMY (Left)     Patient location during evaluation: PACU Anesthesia Type: General Level of consciousness: awake and alert Pain management: pain level controlled Vital Signs Assessment: post-procedure vital signs reviewed and stable Respiratory status: spontaneous breathing, nonlabored ventilation, respiratory function stable and patient connected to nasal cannula oxygen Cardiovascular status: blood pressure returned to baseline and stable Postop Assessment: no signs of nausea or vomiting Anesthetic complications: no    Last Vitals:  Vitals:   06/10/16 1130 06/10/16 1208  BP: (!) 104/40 (!) 105/41  Pulse: 85 67  Resp: 16 16  Temp: 36.4 C 36.5 C    Last Pain:  Vitals:   06/10/16 1130  TempSrc:   PainSc: 0-No pain   Pain Goal: Patients Stated Pain Goal: 5 (06/10/16 1130)               Vinita Prentiss P Lucile Hillmann

## 2016-06-10 NOTE — Discharge Instructions (Addendum)
Call [facility] @ [phone] if:  You have a temperature greater than or equal to 100.4 degrees Farenheit orally You have pain that is not made better by the pain medication given and taken as directed You have excessive bleeding or problems urinating  Take Colace (Docusate Sodium/Stool Softener) 100 mg 2-3 times daily while taking narcotic pain medicine to avoid constipation or until bowel movements are regular.  You may drive after 36 hours You may walk up steps  You may shower tomorrow You may resume a regular diet  Keep incisions clean and dry, remove honeycomb dressing on June 17, 2016 Avoid anything in vagina until after your post-operative visit   Post Anesthesia Home Care Instructions  NO IBUPROFEN PRODUCTS UNTIL: 3:45 today  Activity: Get plenty of rest for the remainder of the day. A responsible individual must stay with you for 24 hours following the procedure.  For the next 24 hours, DO NOT: -Drive a car -Paediatric nurse -Drink alcoholic beverages -Take any medication unless instructed by your physician -Make any legal decisions or sign important papers.  Meals: Start with liquid foods such as gelatin or soup. Progress to regular foods as tolerated. Avoid greasy, spicy, heavy foods. If nausea and/or vomiting occur, drink only clear liquids until the nausea and/or vomiting subsides. Call your physician if vomiting continues.  Special Instructions/Symptoms: Your throat may feel dry or sore from the anesthesia or the breathing tube placed in your throat during surgery. If this causes discomfort, gargle with warm salt water. The discomfort should disappear within 24 hours.  If you had a scopolamine patch placed behind your ear for the management of post- operative nausea and/or vomiting:  1. The medication in the patch is effective for 72 hours, after which it should be removed.  Wrap patch in a tissue and discard in the trash. Wash hands thoroughly  with soap and water. 2. You may remove the patch earlier than 72 hours if you experience unpleasant side effects which may include dry mouth, dizziness or visual disturbances. 3. Avoid touching the patch. Wash your hands with soap and water after contact with the patch.

## 2016-06-10 NOTE — Transfer of Care (Signed)
Immediate Anesthesia Transfer of Care Note  Patient: [name]  Procedure(s) Performed: Procedure(s): LAPAROSCOPIC OOPHORECTOMY (Left)  Patient Location: PACU  Anesthesia Type:General  Level of Consciousness:  sedated, patient cooperative and responds to stimulation  Airway & Oxygen Therapy:Patient Spontanous Breathing and Patient connected to face mask oxgen  Post-op Assessment:  Report given to PACU RN and Post -op Vital signs reviewed and stable  Post vital signs:  Reviewed and stable  Last Vitals:  Vitals:   06/10/16 0724  BP: 117/80  Pulse: 80  Resp: 16  Temp: 71.8 C    Complications: No apparent anesthesia complications

## 2016-06-10 NOTE — Op Note (Signed)
Preop Diagnosis: 1.Pelvic Pain 2.Left Endomterioma  Postop Diagnosis: 1.Pelvic Pain 2.Left Endomterioma  Procedure: LAPAROSCOPIC LEFT SALPINGO-OOPHORECTOMY   Anesthesia: General   Anesthesiologist: Murvin Natal, MD   Attending: Everett Graff, MD   Assistant:  E. Florene Glen, PA-C  Findings: Left endometrioma  Pathology: Left ovary with endometrioma and left fallopian tube  Fluids: 1400 cc  UOP: 200 cc  EBL: 10 cc  Complications: None  Procedure:  The patient was taken to the operating room after the risks, benefits, alternatives, complications, treatment options, and expected outcomes were discussed with the patient. The patient verbalized understanding, the patient concurred with the proposed plan and consent signed and witnessed. The patient was taken to the Operating Room and identified as [name] and the procedure verified as lap left salpingo-oophorectomy.  The patient was placed under general anesthesia per anesthesia staff, the patient was placed in modified dorsal lithotomy position and was prepped, draped, and catheterized in the normal, sterile fashion.  A Time Out was held and the above information confirmed.  The cervix was visualized and an intrauterine manipulator was placed.  A 10 mm umbilical incision was then performed. Veress needle was passed and pneumoperitoneum was established. A 10 mm trocar was advanced into the intraabdominal cavity, the laparoscope was introduced and findings as noted above.  The same was done in the suprapubic area.  Patient was placed in trendelenburg and marcaine injected in the LLQ and a 5 mm incision was made and 5 mm trocar advanced into the intraabdominal cavity.    The left fallopian tube was excised along its length with the tripolar and removed through the 32mm suprapubic port.  The left infundibulopelvic ligament was cauterized for three consecutive burns and the tissue adjacent to the left ovary at the mesosalpinx and  utero-ovarian were cauterized as well  The left utero-ovarian was cut and the adjacent mesosalpinx as well.  An endoloop was placed and ovary excised and removed through the suprapubic port.  Good hemostasis was noted.  There was a uterine perforation noted at the fundus from the hulka and the area was cauterized and good hemostasis noted.  Surgicel was placed and hulka had been removed.  The endometrioma ruptured and copious irrigation of the intra-abdominal cavity was noted.  Pneumoperitoneum was relieved after repairing fascia and 58mm suprapubic port with 0 vicryl.  The LLQ port was removed under direct visualization.  The umbilical 95AO port was removed under direct visualization as well and fascia repaired with 0 vicryl.  The 10 mm skin incisions were repaired with 3-0 monocryl via a subcuticular stitch and dermabond was applied to all incisions.  Sponge, instrument, lap and needle counts were correct.  The patient tolerated the procedure well and was awaiting extubation and transfer to the recovery room in good condition.

## 2016-06-23 ENCOUNTER — Encounter (HOSPITAL_COMMUNITY): Payer: 59

## 2016-06-23 ENCOUNTER — Ambulatory Visit: Payer: 59 | Admitting: Vascular Surgery

## 2016-06-30 ENCOUNTER — Ambulatory Visit: Payer: 59 | Admitting: Vascular Surgery

## 2016-06-30 ENCOUNTER — Encounter (HOSPITAL_COMMUNITY): Payer: 59

## 2016-08-17 ENCOUNTER — Ambulatory Visit (INDEPENDENT_AMBULATORY_CARE_PROVIDER_SITE_OTHER): Payer: 59 | Admitting: Family Medicine

## 2016-08-17 ENCOUNTER — Encounter: Payer: Self-pay | Admitting: Family Medicine

## 2016-08-17 VITALS — BP 126/90 | HR 83 | Temp 98.4°F | Ht 60.0 in | Wt 103.0 lb

## 2016-08-17 DIAGNOSIS — Z124 Encounter for screening for malignant neoplasm of cervix: Secondary | ICD-10-CM | POA: Diagnosis not present

## 2016-08-17 DIAGNOSIS — Z01419 Encounter for gynecological examination (general) (routine) without abnormal findings: Secondary | ICD-10-CM | POA: Diagnosis not present

## 2016-08-17 DIAGNOSIS — Z0000 Encounter for general adult medical examination without abnormal findings: Secondary | ICD-10-CM

## 2016-08-17 DIAGNOSIS — Z23 Encounter for immunization: Secondary | ICD-10-CM | POA: Diagnosis not present

## 2016-08-17 DIAGNOSIS — Z6820 Body mass index (BMI) 20.0-20.9, adult: Secondary | ICD-10-CM | POA: Diagnosis not present

## 2016-08-17 LAB — BASIC METABOLIC PANEL
BUN: 14 mg/dL (ref 6–23)
CALCIUM: 9.3 mg/dL (ref 8.4–10.5)
CO2: 28 meq/L (ref 19–32)
Chloride: 101 mEq/L (ref 96–112)
Creatinine, Ser: 0.58 mg/dL (ref 0.40–1.20)
GFR: 121.04 mL/min (ref >60.00)
Glucose, Bld: 82 mg/dL (ref 70–99)
Potassium: 4 mEq/L (ref 3.5–5.1)
SODIUM: 137 meq/L (ref 135–145)

## 2016-08-17 LAB — LIPID PANEL
Cholesterol: 196 mg/dL (ref 0–200)
HDL: 100.2 mg/dL (ref >39.00)
LDL Cholesterol: 84 mg/dL (ref 0–99)
NONHDL: 95.55
Total CHOL/HDL Ratio: 2
Triglycerides: 59 mg/dL (ref 0.0–149.0)
VLDL: 11.8 mg/dL (ref 0.0–40.0)

## 2016-08-17 LAB — POC URINALSYSI DIPSTICK (AUTOMATED)
Bilirubin, UA: NEGATIVE
Clarity, UA: NEGATIVE
GLUCOSE UA: NEGATIVE
Ketones, UA: NEGATIVE
Leukocytes, UA: NEGATIVE
NITRITE UA: NEGATIVE
Protein, UA: NEGATIVE
RBC UA: NEGATIVE
Spec Grav, UA: 1.01 (ref 1.010–1.025)
UROBILINOGEN UA: 0.2 U/dL
pH, UA: 6 (ref 5.0–8.0)

## 2016-08-17 LAB — CBC WITH DIFFERENTIAL/PLATELET
BASOS ABS: 0 10*3/uL (ref 0.0–0.1)
BASOS PCT: 0.7 % (ref 0.0–3.0)
EOS ABS: 0.1 10*3/uL (ref 0.0–0.7)
Eosinophils Relative: 2.6 % (ref 0.0–5.0)
HEMATOCRIT: 41.8 % (ref 36.0–46.0)
HEMOGLOBIN: 14.4 g/dL (ref 12.0–15.0)
LYMPHS PCT: 28.1 % (ref 12.0–46.0)
Lymphs Abs: 1.1 10*3/uL (ref 0.7–4.0)
MCHC: 34.5 g/dL (ref 30.0–36.0)
MCV: 93.8 fl (ref 78.0–100.0)
Monocytes Absolute: 0.4 10*3/uL (ref 0.1–1.0)
Monocytes Relative: 10 % (ref 3.0–12.0)
Neutro Abs: 2.2 10*3/uL (ref 1.4–7.7)
Neutrophils Relative %: 58.6 % (ref 43.0–77.0)
Platelets: 225 10*3/uL (ref 150.0–400.0)
RBC: 4.46 Mil/uL (ref 3.87–5.11)
RDW: 13 % (ref 11.5–15.5)
WBC: 3.8 10*3/uL — AB (ref 4.0–10.5)

## 2016-08-17 LAB — HEPATIC FUNCTION PANEL
ALBUMIN: 5 g/dL (ref 3.5–5.2)
ALK PHOS: 47 U/L (ref 39–117)
ALT: 11 U/L (ref 0–35)
AST: 18 U/L (ref 0–37)
BILIRUBIN TOTAL: 1.7 mg/dL — AB (ref 0.2–1.2)
Bilirubin, Direct: 0.3 mg/dL (ref 0.0–0.3)
TOTAL PROTEIN: 6.8 g/dL (ref 6.0–8.3)

## 2016-08-17 LAB — TSH: TSH: 1.65 u[IU]/mL (ref 0.35–4.50)

## 2016-08-17 MED ORDER — POTASSIUM CHLORIDE CRYS ER 10 MEQ PO TBCR: 10.0000 meq | EXTENDED_RELEASE_TABLET | Freq: Two times a day (BID) | ORAL | 3 refills | Status: DC

## 2016-08-17 MED ORDER — PROMETHAZINE HCL 25 MG PO TABS: 25.0000 mg | ORAL_TABLET | ORAL | 2 refills | Status: DC | PRN

## 2016-08-17 NOTE — Progress Notes (Signed)
   Subjective:    Patient ID: [name], female    DOB: Dec 20, 1975, 41 y.o.   MRN: [id number]  HPI Here for a well exam. She feels well but does mention trouble sleeping for the past few weeks. She falls asleep quickly but wakes up early and cannot go back to sleep. Melatonin does not help. She and her husband are still going through the process of adopting a child from [place].    Review of Systems  Constitutional: Negative.   HENT: Negative.   Eyes: Negative.   Respiratory: Negative.   Cardiovascular: Negative.   Gastrointestinal: Negative.   Genitourinary: Negative for decreased urine volume, difficulty urinating, dyspareunia, dysuria, enuresis, flank pain, frequency, hematuria, pelvic pain and urgency.  Musculoskeletal: Negative.   Skin: Negative.   Neurological: Negative.   Psychiatric/Behavioral: Positive for sleep disturbance. Negative for agitation, behavioral problems, confusion, decreased concentration, dysphoric mood and hallucinations. The patient is not nervous/anxious and is not hyperactive.        Objective:   Physical Exam  Constitutional: She is oriented to person, place, and time. She appears well-developed and well-nourished. No distress.  HENT:  Head: Normocephalic and atraumatic.  Right Ear: External ear normal.  Left Ear: External ear normal.  Nose: Nose normal.  Mouth/Throat: Oropharynx is clear and moist. No oropharyngeal exudate.  Eyes: Pupils are equal, round, and reactive to light. Conjunctivae and EOM are normal. No scleral icterus.  Neck: Normal range of motion. Neck supple. No JVD present. No thyromegaly present.  No carotid bruits   Cardiovascular: Normal rate, regular rhythm, normal heart sounds and intact distal pulses.  Exam reveals no gallop and no friction rub.   No murmur heard. Pulmonary/Chest: Effort normal and breath sounds normal. No respiratory distress. She has no wheezes. She has no rales. She exhibits no tenderness.  Abdominal: Soft.  Bowel sounds are normal. She exhibits no distension and no mass. There is no tenderness. There is no rebound and no guarding.  Musculoskeletal: Normal range of motion. She exhibits no edema or tenderness.  Lymphadenopathy:    She has no cervical adenopathy.  Neurological: She is alert and oriented to person, place, and time. She has normal reflexes. No cranial nerve deficit. She exhibits normal muscle tone. Coordination normal.  Skin: Skin is warm and dry. No rash noted. No erythema.  Psychiatric: She has a normal mood and affect. Her behavior is normal. Judgment and thought content normal.          Assessment & Plan:  Well exam. We discussed diet and exercise. Given a TDaP. Get fasting labs. She will try Benadryl for sleep.  Alysia Penna, MD

## 2016-08-17 NOTE — Patient Instructions (Signed)
WE NOW OFFER   [facility] FAST TRACK!!!  SAME DAY Appointments for ACUTE CARE  Such as: Sprains, Injuries, cuts, abrasions, rashes, muscle pain, joint pain, back pain Colds, flu, sore throats, headache, allergies, cough, fever  Ear pain, sinus and eye infections Abdominal pain, nausea, vomiting, diarrhea, upset stomach Animal/insect bites  3 Easy Ways to Schedule: Walk-In Scheduling Call in scheduling Mychart Sign-up: [email or web address]         

## 2016-08-18 DIAGNOSIS — Z124 Encounter for screening for malignant neoplasm of cervix: Secondary | ICD-10-CM | POA: Diagnosis not present

## 2016-08-18 LAB — CORTISOL-AM, BLOOD: CORTISOL - AM: 10.9 ug/dL

## 2016-08-20 ENCOUNTER — Telehealth: Payer: Self-pay | Admitting: Family Medicine

## 2016-08-20 NOTE — Telephone Encounter (Signed)
Pt request addition to form already completed for their adoption process to [place]. Please addend to notate the reason for her diagnosis. Pt concerned of [place] interpretation. Pt will pick up form on Monday 08/23/16.

## 2016-09-02 ENCOUNTER — Encounter: Payer: Self-pay | Admitting: Family Medicine

## 2016-09-02 NOTE — Telephone Encounter (Signed)
It is ready to be picked up

## 2016-09-23 ENCOUNTER — Encounter: Payer: Self-pay | Admitting: Family Medicine

## 2016-11-14 DIAGNOSIS — Z23 Encounter for immunization: Secondary | ICD-10-CM | POA: Diagnosis not present

## 2017-04-14 DIAGNOSIS — J4520 Mild intermittent asthma, uncomplicated: Secondary | ICD-10-CM | POA: Diagnosis not present

## 2017-04-14 DIAGNOSIS — J3089 Other allergic rhinitis: Secondary | ICD-10-CM | POA: Diagnosis not present

## 2017-04-14 DIAGNOSIS — J301 Allergic rhinitis due to pollen: Secondary | ICD-10-CM | POA: Diagnosis not present

## 2017-05-18 ENCOUNTER — Encounter: Payer: Self-pay | Admitting: Family Medicine

## 2017-05-18 NOTE — Telephone Encounter (Signed)
No need to see me. Have them set up nurses visits to get both Hep A and one MMR

## 2017-05-18 NOTE — Telephone Encounter (Signed)
Pt is asking about the Hep A and MMR vaccines. Please advise on your preference.

## 2017-05-31 ENCOUNTER — Ambulatory Visit (INDEPENDENT_AMBULATORY_CARE_PROVIDER_SITE_OTHER): Payer: 59 | Admitting: Family Medicine

## 2017-05-31 DIAGNOSIS — Z23 Encounter for immunization: Secondary | ICD-10-CM

## 2017-08-09 DIAGNOSIS — Z7689 Persons encountering health services in other specified circumstances: Secondary | ICD-10-CM | POA: Diagnosis not present

## 2017-08-09 DIAGNOSIS — L814 Other melanin hyperpigmentation: Secondary | ICD-10-CM | POA: Diagnosis not present

## 2017-08-09 DIAGNOSIS — L821 Other seborrheic keratosis: Secondary | ICD-10-CM | POA: Diagnosis not present

## 2017-08-09 DIAGNOSIS — D229 Melanocytic nevi, unspecified: Secondary | ICD-10-CM | POA: Diagnosis not present

## 2017-08-23 ENCOUNTER — Ambulatory Visit: Payer: 59 | Admitting: Family Medicine

## 2017-08-23 ENCOUNTER — Encounter: Payer: Self-pay | Admitting: Family Medicine

## 2017-08-23 VITALS — BP 134/80 | HR 97 | Temp 98.0°F | Ht 60.0 in | Wt 99.4 lb

## 2017-08-23 DIAGNOSIS — K121 Other forms of stomatitis: Secondary | ICD-10-CM | POA: Diagnosis not present

## 2017-08-23 DIAGNOSIS — H33001 Unspecified retinal detachment with retinal break, right eye: Secondary | ICD-10-CM | POA: Diagnosis not present

## 2017-08-23 LAB — CBC WITH DIFFERENTIAL/PLATELET
BASOS PCT: 0.7 % (ref 0.0–3.0)
Basophils Absolute: 0 10*3/uL (ref 0.0–0.1)
EOS PCT: 3.1 % (ref 0.0–5.0)
Eosinophils Absolute: 0.1 10*3/uL (ref 0.0–0.7)
HEMATOCRIT: 39.8 % (ref 36.0–46.0)
HEMOGLOBIN: 13.5 g/dL (ref 12.0–15.0)
Lymphocytes Relative: 32.2 % (ref 12.0–46.0)
Lymphs Abs: 1.2 10*3/uL (ref 0.7–4.0)
MCHC: 34 g/dL (ref 30.0–36.0)
MCV: 91.8 fl (ref 78.0–100.0)
MONOS PCT: 10.2 % (ref 3.0–12.0)
Monocytes Absolute: 0.4 10*3/uL (ref 0.1–1.0)
Neutro Abs: 2 10*3/uL (ref 1.4–7.7)
Neutrophils Relative %: 53.8 % (ref 43.0–77.0)
Platelets: 232 10*3/uL (ref 150.0–400.0)
RBC: 4.33 Mil/uL (ref 3.87–5.11)
RDW: 13 % (ref 11.5–15.5)
WBC: 3.7 10*3/uL — AB (ref 4.0–10.5)

## 2017-08-23 LAB — TSH: TSH: 1.79 u[IU]/mL (ref 0.35–4.50)

## 2017-08-23 LAB — BASIC METABOLIC PANEL
BUN: 11 mg/dL (ref 6–23)
CHLORIDE: 101 meq/L (ref 96–112)
CO2: 31 mEq/L (ref 19–32)
CREATININE: 0.63 mg/dL (ref 0.40–1.20)
Calcium: 9.8 mg/dL (ref 8.4–10.5)
GFR: 109.49 mL/min (ref >60.00)
Glucose, Bld: 85 mg/dL (ref 70–99)
POTASSIUM: 3.6 meq/L (ref 3.5–5.1)
Sodium: 139 mEq/L (ref 135–145)

## 2017-08-23 LAB — HEPATIC FUNCTION PANEL
ALT: 12 U/L (ref 0–35)
AST: 18 U/L (ref 0–37)
Albumin: 4.9 g/dL (ref 3.5–5.2)
Alkaline Phosphatase: 55 U/L (ref 39–117)
BILIRUBIN DIRECT: 0.2 mg/dL (ref 0.0–0.3)
BILIRUBIN TOTAL: 1.4 mg/dL — AB (ref 0.2–1.2)
Total Protein: 7.5 g/dL (ref 6.0–8.3)

## 2017-08-23 MED ORDER — VALACYCLOVIR HCL 1 G PO TABS: 1000.0000 mg | ORAL_TABLET | Freq: Three times a day (TID) | ORAL | 2 refills | Status: DC

## 2017-08-23 NOTE — Progress Notes (Signed)
   Subjective:    Patient ID: [name], female    DOB: 02/21/1975, 42 y.o.   MRN: [id number]  HPI Here for a 3 day hx of burning and irritation in the lips with blisters and peeling. No recent change in lip stick or lip balm. She does have a hx of fever blisters. She just returned from [place] 3 weeks ago to bring home the young girl she has adopted, and she says the 6 weeks they spent in [place] were very stressful. She saw Dermatology yesterday and they weren't sure of the diagnosis, but they gave her a steroid ointment to apply. This has helped to a degree. Also yesterday morning about 10:30 am she had the sudden onset of a smoky appearance to the vision in her right eye. No vision loss. No eye pain or headache. She will see her optometrist this afternoon to evaluate this. She wears extended wear contact lenses which she changes out monthly.    Review of Systems  Constitutional: Negative.   HENT: Positive for mouth sores. Negative for sore throat and trouble swallowing.   Eyes: Positive for visual disturbance. Negative for photophobia, pain, discharge, redness and itching.  Respiratory: Negative.   Cardiovascular: Negative.   Neurological: Negative.        Objective:   Physical Exam  Constitutional: She is oriented to person, place, and time. She appears well-developed and well-nourished.  HENT:  Right Ear: External ear normal.  Left Ear: External ear normal.  Nose: Nose normal.  Both lips are mildly swollen, they are tender, and they are covered in blisters and scaling   Eyes: Pupils are equal, round, and reactive to light. Conjunctivae and EOM are normal.  Neck: No thyromegaly present.  Pulmonary/Chest: Effort normal and breath sounds normal.  Lymphadenopathy:    She has no cervical adenopathy.  Neurological: She is alert and oriented to person, place, and time. No cranial nerve deficit.          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 The stress of her recent trip to [place] likely brought this on. She will treat it with Valtrex 1000 mg TID for 5 days. She may continue to apply the steroid ointment as needed. She will see Optometry about her vision changes later today.  Alysia Penna, MD

## 2017-08-24 DIAGNOSIS — H43811 Vitreous degeneration, right eye: Secondary | ICD-10-CM | POA: Diagnosis not present

## 2017-08-24 DIAGNOSIS — H4311 Vitreous hemorrhage, right eye: Secondary | ICD-10-CM | POA: Diagnosis not present

## 2017-08-24 DIAGNOSIS — H35371 Puckering of macula, right eye: Secondary | ICD-10-CM | POA: Diagnosis not present

## 2017-08-25 LAB — ANTI-NUCLEAR AB-TITER (ANA TITER): ANA Titer 1: 1:40 {titer} — ABNORMAL HIGH

## 2017-08-25 LAB — ANTI-DNA ANTIBODY, DOUBLE-STRANDED: ds DNA Ab: <1 IU/mL

## 2017-08-25 LAB — ANA: ANA: POSITIVE — AB

## 2017-08-31 DIAGNOSIS — Z23 Encounter for immunization: Secondary | ICD-10-CM | POA: Diagnosis not present

## 2017-09-02 DIAGNOSIS — Z124 Encounter for screening for malignant neoplasm of cervix: Secondary | ICD-10-CM | POA: Diagnosis not present

## 2017-09-02 DIAGNOSIS — N803 Endometriosis of pelvic peritoneum: Secondary | ICD-10-CM | POA: Diagnosis not present

## 2017-09-02 DIAGNOSIS — Z01419 Encounter for gynecological examination (general) (routine) without abnormal findings: Secondary | ICD-10-CM | POA: Diagnosis not present

## 2017-09-02 DIAGNOSIS — Z1231 Encounter for screening mammogram for malignant neoplasm of breast: Secondary | ICD-10-CM | POA: Diagnosis not present

## 2017-09-07 DIAGNOSIS — H43811 Vitreous degeneration, right eye: Secondary | ICD-10-CM | POA: Diagnosis not present

## 2017-09-07 DIAGNOSIS — H4311 Vitreous hemorrhage, right eye: Secondary | ICD-10-CM | POA: Diagnosis not present

## 2017-09-07 DIAGNOSIS — H35371 Puckering of macula, right eye: Secondary | ICD-10-CM | POA: Diagnosis not present

## 2017-09-07 DIAGNOSIS — H43391 Other vitreous opacities, right eye: Secondary | ICD-10-CM | POA: Diagnosis not present

## 2017-09-21 ENCOUNTER — Other Ambulatory Visit: Payer: Self-pay | Admitting: Obstetrics and Gynecology

## 2017-09-21 DIAGNOSIS — E041 Nontoxic single thyroid nodule: Secondary | ICD-10-CM

## 2017-09-28 ENCOUNTER — Ambulatory Visit
Admission: RE | Admit: 2017-09-28 | Discharge: 2017-09-28 | Disposition: A | Discharge status: Home / Self Care | Payer: 59 | Source: Ambulatory Visit | Attending: Obstetrics and Gynecology | Admitting: Obstetrics and Gynecology

## 2017-09-28 DIAGNOSIS — E041 Nontoxic single thyroid nodule: Secondary | ICD-10-CM | POA: Diagnosis not present

## 2017-10-12 DIAGNOSIS — H35371 Puckering of macula, right eye: Secondary | ICD-10-CM | POA: Diagnosis not present

## 2017-10-13 ENCOUNTER — Other Ambulatory Visit: Payer: Self-pay | Admitting: Obstetrics and Gynecology

## 2017-10-13 DIAGNOSIS — N921 Excessive and frequent menstruation with irregular cycle: Secondary | ICD-10-CM | POA: Diagnosis not present

## 2017-10-13 DIAGNOSIS — R102 Pelvic and perineal pain: Secondary | ICD-10-CM | POA: Diagnosis not present

## 2017-10-13 DIAGNOSIS — N939 Abnormal uterine and vaginal bleeding, unspecified: Secondary | ICD-10-CM | POA: Diagnosis not present

## 2017-10-13 DIAGNOSIS — N800 Endometriosis of uterus: Secondary | ICD-10-CM | POA: Diagnosis not present

## 2017-11-27 ENCOUNTER — Other Ambulatory Visit: Payer: Self-pay | Admitting: Family Medicine

## 2017-11-30 NOTE — Telephone Encounter (Signed)
Pt checking status on her potassium chloride (K-DUR,KLOR-CON) 10 MEQ tablet  Refill. She will be out tomorrow.

## 2017-12-13 DIAGNOSIS — H35371 Puckering of macula, right eye: Secondary | ICD-10-CM | POA: Diagnosis not present

## 2018-02-01 DIAGNOSIS — M810 Age-related osteoporosis without current pathological fracture: Secondary | ICD-10-CM | POA: Diagnosis not present

## 2018-02-19 ENCOUNTER — Other Ambulatory Visit: Payer: Self-pay | Admitting: Family Medicine

## 2018-04-18 DIAGNOSIS — J4520 Mild intermittent asthma, uncomplicated: Secondary | ICD-10-CM | POA: Diagnosis not present

## 2018-04-18 DIAGNOSIS — J301 Allergic rhinitis due to pollen: Secondary | ICD-10-CM | POA: Diagnosis not present

## 2018-04-18 DIAGNOSIS — J3089 Other allergic rhinitis: Secondary | ICD-10-CM | POA: Diagnosis not present

## 2018-05-15 ENCOUNTER — Other Ambulatory Visit: Payer: Self-pay | Admitting: Family Medicine

## 2018-08-08 ENCOUNTER — Ambulatory Visit (INDEPENDENT_AMBULATORY_CARE_PROVIDER_SITE_OTHER): Payer: 59 | Admitting: Family Medicine

## 2018-08-08 ENCOUNTER — Encounter: Payer: Self-pay | Admitting: Family Medicine

## 2018-08-08 ENCOUNTER — Other Ambulatory Visit: Payer: Self-pay

## 2018-08-08 DIAGNOSIS — M79605 Pain in left leg: Secondary | ICD-10-CM

## 2018-08-08 DIAGNOSIS — M79604 Pain in right leg: Secondary | ICD-10-CM | POA: Diagnosis not present

## 2018-08-08 DIAGNOSIS — R21 Rash and other nonspecific skin eruption: Secondary | ICD-10-CM | POA: Diagnosis not present

## 2018-08-08 MED ORDER — POTASSIUM CHLORIDE CRYS ER 10 MEQ PO TBCR: 10.0000 meq | EXTENDED_RELEASE_TABLET | Freq: Two times a day (BID) | ORAL | 3 refills | Status: DC

## 2018-08-08 NOTE — Progress Notes (Signed)
Subjective:    Patient ID: [name], female    DOB: 05-18-1975, 43 y.o.   MRN: [id number]  HPI Virtual Visit via Video Note  I connected with the patient on 08/08/18 at 10:30 AM EDT by a video enabled telemedicine application and verified that I am speaking with the correct person using two identifiers.  Location patient: home Location provider:work or home office Persons participating in the virtual visit: patient, provider  I discussed the limitations of evaluation and management by telemedicine and the availability of in person appointments. The patient expressed understanding and agreed to proceed.   HPI: Here asking my advice about possible lupus. She has a hx of autoimmune conditions like fibromuscular dysplasia, and she has been dealing with intermittent rashes on the face and trunk for several years. She also has frequent pains in both legs which she describes as "electric shocks". I had recommended she see a rheumatologist last year, but she declined because her newly adopted child kept her busy. Now she interested in pursuing this.    ROS: See pertinent positives and negatives per HPI.  Past Medical History:  Diagnosis Date  . Allergy   . Asthma    sees Dr. Harold Hedge   . Chronic headaches    Migraine  . Endometriosis   . Fibromuscular dysplasia of cervicocranial artery ([place])   . Hypoglycemia   . Internal carotid artery dissection [facility])    saw Dr. Gae Gallop   . Osteoporosis   . Paresthesia of left upper limb    saw Dr. Eddie North   . PONV (postoperative nausea and vomiting)   . 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 APPENDECTOMY    . DILATION AND CURETTAGE OF UTERUS     hysteroscopy  . LAPAROSCOPIC OOPHERECTOMY Left 06/10/2016   per Dr. Everett Graff for endometriosis   . LAPAROSCOPIC OVARIAN CYSTECTOMY Bilateral 07/04/2014   Procedure: LAPAROSCOPIC OVARIAN  CYSTECTOMY;  Surgeon: Everett Graff, MD;  Location: [facility];  Service: Gynecology;  Laterality: Bilateral;  . WISDOM TOOTH EXTRACTION      Family History  Problem Relation Age of Onset  . Diabetes Father   . Heart disease Father   . Hypertension Father   . Varicose Veins Father   . Bleeding Disorder Mother   . Varicose Veins Mother   . Hypertension Mother      Current Outpatient Medications:  .  aspirin 81 MG tablet, Take 81 mg by mouth every evening. , Disp: , Rfl:  .  azelastine (ASTELIN) 0.1 % nasal spray, Place 2 sprays into both nostrils 2 (two) times daily. Use in each nostril as directed, Disp: , Rfl:  .  b complex vitamins tablet, Take 1 tablet by mouth daily., Disp: , Rfl:  .  CALCIUM-VITAMIN D PO, Take 1 tablet by mouth 3 (three) times daily., Disp: , Rfl:  .  Cholecalciferol (D3-1000) 1000 units tablet, Take 1,000 Units by mouth daily., Disp: , Rfl:  .  Dapsone (ACZONE) 5 % topical gel, Apply 1 application topically at bedtime. , Disp: , Rfl:  .  fexofenadine-pseudoephedrine (ALLEGRA-D) 60-120 MG per tablet, Take 1 tablet by mouth daily. 12 hour, Disp: , Rfl:  .  hydrocortisone 2.5 % cream, Apply topically 2 (two) times daily., Disp: , Rfl:  .  ibuprofen (ADVIL,MOTRIN) 600 MG tablet, 1 po  pc   every 6 hours for 5 days  then as needed for pain; first dose 4 p.m. 06/10/16, Disp: 30 tablet, Rfl: 1 .  Lactobacillus (PROBIOTIC ACIDOPHILUS PO), Take 1 capsule by mouth daily., Disp: , Rfl:  .  levalbuterol (XOPENEX HFA) 45 MCG/ACT inhaler, Inhale 1-2 puffs into the lungs every 4 (four) hours as needed for wheezing or shortness of breath. Reported on 02/18/2015, Disp: , Rfl:  .  Magnesium 250 MG TABS, Take 1 tablet by mouth daily at 12 noon. , Disp: , Rfl:  .  Multiple Vitamins-Minerals (MULTIVITAMIN,TX-MINERALS) tablet, Take 1 tablet by mouth daily at 12 noon. , Disp: , Rfl:  .  niacin 500 MG tablet, Take 500 mg by mouth daily. , Disp: , Rfl:  .  Olopatadine HCl 0.6 % SOLN, Place into  the nose., Disp: , Rfl:  .  potassium chloride (KLOR-CON M10) 10 MEQ tablet, Take 1 tablet (10 mEq total) by mouth 2 (two) times daily., Disp: 180 tablet, Rfl: 3 .  promethazine (PHENERGAN) 25 MG tablet, Take 1 tablet (25 mg total) by mouth every 4 (four) hours as needed for nausea or vomiting., Disp: 60 tablet, Rfl: 2 .  tretinoin (RETIN-A) 0.025 % cream, Apply 1 application topically at bedtime., Disp: , Rfl:  .  valACYclovir (VALTREX) 1000 MG tablet, Take 1 tablet (1,000 mg total) by mouth 3 (three) times daily., Disp: 15 tablet, Rfl: 2  EXAM:  VITALS per patient if applicable:  GENERAL: alert, oriented, appears well and in no acute distress  HEENT: atraumatic, conjunttiva clear, no obvious abnormalities on inspection of external nose and ears  NECK: normal movements of the head and neck  LUNGS: on inspection no signs of respiratory distress, breathing rate appears normal, no obvious gross SOB, gasping or wheezing  CV: no obvious cyanosis  MS: moves all visible extremities without noticeable abnormality  PSYCH/NEURO: pleasant and cooperative, no obvious depression or anxiety, speech and thought processing grossly intact  ASSESSMENT AND PLAN: Rashes and leg pains, possible lupus. We will refer her to Rheumatology to evaluate.  Alysia Penna, MD  Discussed the following assessment and plan:  Pain in both lower extremities - Plan: Ambulatory referral to Rheumatology  Rash and other nonspecific skin eruption - Plan: Ambulatory referral to Rheumatology     I discussed the assessment and treatment plan with the patient. The patient was provided an opportunity to ask questions and all were answered. The patient agreed with the plan and demonstrated an understanding of the instructions.   The patient was advised to call back or seek an in-person evaluation if the symptoms worsen or if the condition fails to improve as anticipated.     Review of Systems     Objective:    Physical Exam        Assessment & Plan:

## 2019-01-09 ENCOUNTER — Telehealth (HOSPITAL_COMMUNITY): Payer: Self-pay

## 2019-01-09 ENCOUNTER — Other Ambulatory Visit: Payer: Self-pay

## 2019-01-09 DIAGNOSIS — I7771 Dissection of carotid artery: Secondary | ICD-10-CM

## 2019-01-09 NOTE — Telephone Encounter (Signed)
Patient reported headaches at night which she attributes to sinus problems.   The above patient or their representative was contacted and gave the following answers to these questions:         Do you have any of the following symptoms? No  Fever                    Cough                   Shortness of breath  Do  you have any of the following other symptoms?    muscle pain         vomiting,        diarrhea        rash         weakness        red eye        abdominal pain         bruising          bruising or bleeding              joint pain           severe headache    Have you been in contact with someone who was or has been sick in the past 2 weeks? No  Yes                 Unsure                         Unable to assess   Does the person that you were in contact with have any of the following symptoms?   Cough         shortness of breath           muscle pain         vomiting,            diarrhea            rash            weakness           fever            red eye           abdominal pain           bruising  or  bleeding                joint pain                severe headache                 COMMENTS OR ACTION PLAN FOR THIS PATIENT:

## 2019-01-10 ENCOUNTER — Encounter: Payer: Self-pay | Admitting: Vascular Surgery

## 2019-01-10 ENCOUNTER — Ambulatory Visit: Payer: 59 | Admitting: Vascular Surgery

## 2019-01-10 ENCOUNTER — Other Ambulatory Visit: Payer: Self-pay

## 2019-01-10 ENCOUNTER — Ambulatory Visit (HOSPITAL_COMMUNITY)
Admission: RE | Admit: 2019-01-10 | Discharge: 2019-01-10 | Disposition: A | Discharge status: Home / Self Care | Payer: 59 | Source: Ambulatory Visit | Attending: Vascular Surgery | Admitting: Vascular Surgery

## 2019-01-10 VITALS — BP 117/82 | HR 85 | Temp 98.4°F | Resp 18 | Ht 60.0 in | Wt 102.0 lb

## 2019-01-10 DIAGNOSIS — I7771 Dissection of carotid artery: Secondary | ICD-10-CM | POA: Diagnosis present

## 2019-01-10 NOTE — Progress Notes (Signed)
Patient name: [name] MRN: [id number] DOB: 27-Apr-1975 Sex: female  REASON FOR VISIT:   Follow-up of carotid artery dissection.  HPI:   [name] is a pleasant 44 y.o. female who I last saw on 04/21/2016.  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  This was treated with Coumadin.  At the time of her last visit 2 years ago her carotid duplex scan was unremarkable and showed no evidence of fibromuscular dysplasia.  I set her up for a 2-year follow-up visit.  Since I saw her last she denies any focal weakness.  She does occasionally get some paresthesias down the posterior aspect of her legs when she lies flat.  She also complains of some occasional low back pain.  I do not get any history of claudication or rest pain.  She is not a smoker.  She denies any expressive or receptive aphasia.  She denies amaurosis fugax.  Past Medical History:  Diagnosis Date  . Allergy   . Asthma    sees Dr. Harold Hedge   . Chronic headaches    Migraine  . Endometriosis   . Fibromuscular dysplasia of cervicocranial artery ([place])   . Hypoglycemia   . Internal carotid artery dissection [facility])    saw Dr. Gae Gallop   . Osteoporosis   . Paresthesia of left upper limb    saw Dr. Eddie North   . PONV (postoperative nausea and vomiting)   . Stroke [facility]) 01/17/2013   age 59 dissection of left carotid artery    Family History  Problem Relation Age of Onset  . Diabetes Father   . Heart disease Father   . Hypertension Father   . Varicose Veins Father   . Bleeding Disorder Mother   . Varicose Veins Mother   . Hypertension Mother     SOCIAL HISTORY: Social History   Tobacco Use  . Smoking status: Never Smoker  . Smokeless tobacco: Never Used  Substance Use Topics  . Alcohol use: Yes    Alcohol/week: 0.0 standard drinks    Comment: rare    Allergies  Allergen Reactions  . Codeine Anaphylaxis    Ears swelled up, patient develops severe blisters  which cause sloughing of the skin  . Cefdinir     REACTION: diarrhea  . Cefuroxime Axetil     REACTION: diarrhea  . Other     MSG migraine  . Red Dye Hives  . Strawberry Extract Hives    Current Outpatient Medications  Medication Sig Dispense Refill  . aspirin 81 MG tablet Take 81 mg by mouth every evening.     Marland Kitchen b complex vitamins tablet Take 1 tablet by mouth daily.    Marland Kitchen CALCIUM-VITAMIN D PO Take 1 tablet by mouth 3 (three) times daily.    . Cholecalciferol (D3-1000) 1000 units tablet Take 1,000 Units by mouth daily.    . fexofenadine-pseudoephedrine (ALLEGRA-D) 60-120 MG per tablet Take 1 tablet by mouth daily. 12 hour    . hydrocortisone 2.5 % cream Apply topically 2 (two) times daily.    Marland Kitchen ibuprofen (ADVIL,MOTRIN) 600 MG tablet 1 po  pc   every 6 hours for 5 days then as needed for pain; first dose 4 p.m. 06/10/16 30 tablet 1  . levalbuterol (XOPENEX HFA) 45 MCG/ACT inhaler Inhale 1-2 puffs into the lungs every 4 (four) hours as needed for wheezing or shortness of breath. Reported on 02/18/2015    . Magnesium 250  MG TABS Take 1 tablet by mouth daily at 12 noon.     . Multiple Vitamins-Minerals (MULTIVITAMIN,TX-MINERALS) tablet Take 1 tablet by mouth daily at 12 noon.     . niacin 500 MG tablet Take 500 mg by mouth daily.     . Olopatadine HCl 0.6 % SOLN Place into the nose.    . potassium chloride (KLOR-CON M10) 10 MEQ tablet Take 1 tablet (10 mEq total) by mouth 2 (two) times daily. 180 tablet 3  . PRESCRIPTION MEDICATION 75 mg daily. Edward Qualia    . promethazine (PHENERGAN) 25 MG tablet Take 1 tablet (25 mg total) by mouth every 4 (four) hours as needed for nausea or vomiting. 60 tablet 2  . valACYclovir (VALTREX) 1000 MG tablet Take 1 tablet (1,000 mg total) by mouth 3 (three) times daily. 15 tablet 2   No current facility-administered medications for this visit.    REVIEW OF SYSTEMS:  [X]  denotes positive finding, [ ]  denotes negative finding Cardiac  Comments:  Chest pain or  chest pressure:    Shortness of breath upon exertion:    Short of breath when lying flat:    Irregular heart rhythm:        Vascular    Pain in calf, thigh, or hip brought on by ambulation:    Pain in thighs at night that wakes you up from your sleep:  x   Blood clot in your veins:    Leg swelling:         Pulmonary    Oxygen at home:    Productive cough:     Wheezing:         Neurologic    Sudden weakness in arms or legs:     Sudden numbness in arms or legs:     Sudden onset of difficulty speaking or slurred speech:    Temporary loss of vision in one eye:     Problems with dizziness:         Gastrointestinal    Blood in stool:     Vomited blood:         Genitourinary    Burning when urinating:     Blood in urine:        Psychiatric    Major depression:         Hematologic    Bleeding problems:    Problems with blood clotting too easily:        Skin    Rashes or ulcers: x       Constitutional    Fever or chills:     PHYSICAL EXAM:   Vitals:   01/10/19 0848 01/10/19 0850  BP: 116/79 117/82  Pulse: 85   Resp: 18   Temp: 98.4 F (36.9 C)   SpO2: 100%   Weight: 102 lb (46.3 kg)   Height: 5' (1.524 m)     GENERAL: The patient is a well-nourished female, in no acute distress. The vital signs are documented above. CARDIAC: There is a regular rate and rhythm.  VASCULAR: I do not detect carotid bruits. She has palpable dorsalis pedis and posterior tibial pulses bilaterally. PULMONARY: There is good air exchange bilaterally without wheezing or rales. ABDOMEN: Soft and non-tender with normal pitched bowel sounds.  MUSCULOSKELETAL: There are no major deformities or cyanosis. NEUROLOGIC: No focal weakness or paresthesias are detected. SKIN: There are no ulcers or rashes noted. PSYCHIATRIC: The patient has a normal affect.  DATA:    CAROTID DUPLEX: I have independently  interpreted her carotid duplex scan today.  On the right side there is a 40 to 59%  stenosis based on velocities however no plaque is seen.  I suspect this is simply related to the size of the artery.  The right vertebral artery is patent with antegrade flow.  On the left side there is a 40 to 59% stenosis based on velocities.  However, there is no plaque seen.  Again this is likely related to the size of the artery.  The left vertebral artery is patent with antegrade flow.  MEDICAL ISSUES:   HISTORY OF LEFT CAROTID DISSECTION: This patient has a history of a left carotid dissection diagnosed in 2015 which was treated with Coumadin.  She is no longer on Coumadin.  She is on aspirin.  This is healed.  She has some mildly elevated velocities in her carotids likely related to the size of the vessel.  I think we can keep her follow-up at 2 years.  She is asymptomatic.  I were to follow-up carotid duplex scan in 2 years and I will see her back at that time.  She will stay on her aspirin.  LOW BACK PAIN: Patient does have some low back pain with some pain in her legs at night when she is lying flat.  I suspect this is related to lumbar degenerative disc disease.  If this progresses then she may need further evaluation for this.  For now I simply recommended that she work on doing some stretches for her back.  I will see her back in 2 years.  She knows to call sooner if she has problems.  Deitra Mayo [facility] [phone]

## 2019-01-16 ENCOUNTER — Other Ambulatory Visit: Payer: Self-pay | Admitting: *Deleted

## 2019-01-16 DIAGNOSIS — I7771 Dissection of carotid artery: Secondary | ICD-10-CM

## 2019-08-07 ENCOUNTER — Other Ambulatory Visit: Payer: Self-pay | Admitting: Family Medicine

## 2019-11-02 ENCOUNTER — Encounter: Payer: 59 | Admitting: Family Medicine

## 2019-12-04 ENCOUNTER — Other Ambulatory Visit: Payer: Self-pay

## 2019-12-04 ENCOUNTER — Encounter: Payer: Self-pay | Admitting: Family Medicine

## 2019-12-04 ENCOUNTER — Ambulatory Visit (INDEPENDENT_AMBULATORY_CARE_PROVIDER_SITE_OTHER): Payer: 59 | Admitting: Family Medicine

## 2019-12-04 VITALS — BP 102/78 | HR 105 | Temp 98.6°F | Ht 60.0 in | Wt 101.8 lb

## 2019-12-04 DIAGNOSIS — Z0000 Encounter for general adult medical examination without abnormal findings: Secondary | ICD-10-CM | POA: Diagnosis not present

## 2019-12-04 NOTE — Progress Notes (Signed)
   Subjective:    Patient ID: [name], female    DOB: 02-17-1975, 44 y.o.   MRN: [id number]  HPI Here for a well exam. She feels well. She saw Dr. Scot Dock in January and she had a good report. Her carotid dopplers were normal. She still takes daily ASA.    Review of Systems  Constitutional: Negative.   HENT: Negative.   Eyes: Negative.   Respiratory: Negative.   Cardiovascular: Negative.   Gastrointestinal: Negative.   Genitourinary: Negative for decreased urine volume, difficulty urinating, dyspareunia, dysuria, enuresis, flank pain, frequency, hematuria, pelvic pain and urgency.  Musculoskeletal: Negative.   Skin: Negative.   Neurological: Negative.   Psychiatric/Behavioral: Negative.        Objective:   Physical Exam Constitutional:      General: She is not in acute distress.    Appearance: She is well-developed.  HENT:     Head: Normocephalic and atraumatic.     Right Ear: External ear normal.     Left Ear: External ear normal.     Nose: Nose normal.     Mouth/Throat:     Pharynx: No oropharyngeal exudate.  Eyes:     General: No scleral icterus.    Conjunctiva/sclera: Conjunctivae normal.     Pupils: Pupils are equal, round, and reactive to light.  Neck:     Thyroid: No thyromegaly.     Vascular: No JVD.  Cardiovascular:     Rate and Rhythm: Normal rate and regular rhythm.     Heart sounds: Normal heart sounds. No murmur heard.  No friction rub. No gallop.   Pulmonary:     Effort: Pulmonary effort is normal. No respiratory distress.     Breath sounds: Normal breath sounds. No wheezing or rales.  Chest:     Chest wall: No tenderness.  Abdominal:     General: Bowel sounds are normal. There is no distension.     Palpations: Abdomen is soft. There is no mass.     Tenderness: There is no abdominal tenderness. There is no guarding or rebound.  Musculoskeletal:        General: No tenderness. Normal range of motion.     Cervical back: Normal range of motion  and neck supple.  Lymphadenopathy:     Cervical: No cervical adenopathy.  Skin:    General: Skin is warm and dry.     Findings: No erythema or rash.  Neurological:     Mental Status: She is alert and oriented to person, place, and time.     Cranial Nerves: No cranial nerve deficit.     Motor: No abnormal muscle tone.     Coordination: Coordination normal.     Deep Tendon Reflexes: Reflexes are normal and symmetric. Reflexes normal.  Psychiatric:        Mood and Affect: Mood normal.        Behavior: Behavior normal.        Thought Content: Thought content normal.        Judgment: Judgment normal.           Assessment & Plan:  Well exam. We discussed diet and exercise. Get fasting labs.  Alysia Penna, MD

## 2019-12-05 LAB — BASIC METABOLIC PANEL WITH GFR
BUN: 16 mg/dL (ref 7–25)
CO2: 29 mmol/L (ref 20–32)
Calcium: 9.5 mg/dL (ref 8.6–10.2)
Chloride: 102 mmol/L (ref 98–110)
Creat: 0.64 mg/dL (ref 0.50–1.10)
GFR, Est African American: 125 mL/min/{1.73_m2} (ref >=60)
GFR, Est Non African American: 108 mL/min/{1.73_m2} (ref >=60)
Glucose, Bld: 79 mg/dL (ref 65–99)
Potassium: 4.6 mmol/L (ref 3.5–5.3)
Sodium: 138 mmol/L (ref 135–146)

## 2019-12-05 LAB — CBC WITH DIFFERENTIAL/PLATELET
Absolute Monocytes: 338 cells/uL (ref 200–950)
Basophils Absolute: 40 cells/uL (ref 0–200)
Basophils Relative: 1.1 %
Eosinophils Absolute: 90 cells/uL (ref 15–500)
Eosinophils Relative: 2.5 %
HCT: 40.1 % (ref 35.0–45.0)
Hemoglobin: 13.7 g/dL (ref 11.7–15.5)
Lymphs Abs: 1080 cells/uL (ref 850–3900)
MCH: 31.4 pg (ref 27.0–33.0)
MCHC: 34.2 g/dL (ref 32.0–36.0)
MCV: 92 fL (ref 80.0–100.0)
MPV: 11.2 fL (ref 7.5–12.5)
Monocytes Relative: 9.4 %
Neutro Abs: 2052 cells/uL (ref 1500–7800)
Neutrophils Relative %: 57 %
Platelets: 213 10*3/uL (ref 140–400)
RBC: 4.36 10*6/uL (ref 3.80–5.10)
RDW: 12 % (ref 11.0–15.0)
Total Lymphocyte: 30 %
WBC: 3.6 10*3/uL — ABNORMAL LOW (ref 3.8–10.8)

## 2019-12-05 LAB — HEPATIC FUNCTION PANEL
AG Ratio: 2 (calc) (ref 1.0–2.5)
ALT: 11 U/L (ref 6–29)
AST: 18 U/L (ref 10–35)
Albumin: 4.7 g/dL (ref 3.6–5.1)
Alkaline phosphatase (APISO): 43 U/L (ref 31–125)
Bilirubin, Direct: 0.3 mg/dL — ABNORMAL HIGH (ref 0.0–0.2)
Globulin: 2.3 g/dL (calc) (ref 1.9–3.7)
Indirect Bilirubin: 1.5 mg/dL (calc) — ABNORMAL HIGH (ref 0.2–1.2)
Total Bilirubin: 1.8 mg/dL — ABNORMAL HIGH (ref 0.2–1.2)
Total Protein: 7 g/dL (ref 6.1–8.1)

## 2019-12-05 LAB — LIPID PANEL
Cholesterol: 208 mg/dL — ABNORMAL HIGH (ref <200)
HDL: 98 mg/dL (ref >=50)
LDL Cholesterol (Calc): 97 mg/dL (calc)
Non-HDL Cholesterol (Calc): 110 mg/dL (calc) (ref <130)
Total CHOL/HDL Ratio: 2.1 (calc) (ref <5.0)
Triglycerides: 44 mg/dL (ref <150)

## 2019-12-05 LAB — VITAMIN D 25 HYDROXY (VIT D DEFICIENCY, FRACTURES): Vit D, 25-Hydroxy: 83 ng/mL (ref 30–100)

## 2019-12-05 LAB — TSH: TSH: 1.45 mIU/L

## 2019-12-11 NOTE — Progress Notes (Signed)
All results are within normal limits

## 2020-01-31 DIAGNOSIS — Z1211 Encounter for screening for malignant neoplasm of colon: Secondary | ICD-10-CM | POA: Diagnosis not present

## 2020-01-31 DIAGNOSIS — R110 Nausea: Secondary | ICD-10-CM | POA: Diagnosis not present

## 2020-02-09 ENCOUNTER — Emergency Department
Admission: EM | Admit: 2020-02-09 | Discharge: 2020-02-09 | Disposition: A | Discharge status: Home / Self Care | Payer: 59 | Source: Home / Self Care

## 2020-02-09 ENCOUNTER — Emergency Department (INDEPENDENT_AMBULATORY_CARE_PROVIDER_SITE_OTHER): Payer: BC Managed Care – PPO

## 2020-02-09 ENCOUNTER — Other Ambulatory Visit: Payer: Self-pay

## 2020-02-09 ENCOUNTER — Encounter: Payer: Self-pay | Admitting: Emergency Medicine

## 2020-02-09 DIAGNOSIS — M25531 Pain in right wrist: Secondary | ICD-10-CM

## 2020-02-09 DIAGNOSIS — R2231 Localized swelling, mass and lump, right upper limb: Secondary | ICD-10-CM | POA: Diagnosis not present

## 2020-02-09 DIAGNOSIS — M79641 Pain in right hand: Secondary | ICD-10-CM

## 2020-02-09 DIAGNOSIS — W228XXA Striking against or struck by other objects, initial encounter: Secondary | ICD-10-CM | POA: Diagnosis not present

## 2020-02-09 DIAGNOSIS — M7989 Other specified soft tissue disorders: Secondary | ICD-10-CM | POA: Diagnosis not present

## 2020-02-09 DIAGNOSIS — S6991XA Unspecified injury of right wrist, hand and finger(s), initial encounter: Secondary | ICD-10-CM

## 2020-02-09 NOTE — ED Triage Notes (Signed)
Patient c/o right wrist/forearm/hand pain since yesterday.  Patient ran into a console yesterday, did experience some swelling, bruising.  Did apply ice, Ibuprofen, elevate, not much relief.

## 2020-02-09 NOTE — Discharge Instructions (Signed)
  Call Monday to schedule a follow up appointment with Sports Medicine next week for further evaluation and treatment of your wrist and hand pain. You should continue to wear your wrist splint until follow up with a specialist.

## 2020-02-09 NOTE — ED Provider Notes (Signed)
[name] CARE    CSN: [id number] Arrival date & time: 02/09/20  [id number]      History   Chief Complaint Chief Complaint  Patient presents with  . Wrist Pain    HPI [name] is a 45 y.o. female.   HPI [name] is a 45 y.o. female presenting to UC with c/o Right wrist pain that started yesterday after hitting her arm on a furniture console.  Pain is aching and sore. She has developed swelling and bruising over ulnar region of wrist. Pain is aching and sore, 8/10, radiating from wrist into hand and forearm. She applied ice, a wrist splint her husband had and taken ibuprofen without much relief. She is Right hand dominant.    Past Medical History:  Diagnosis Date  . Allergy   . Asthma    sees Dr. Harold Hedge   . Chronic headaches    Migraine  . Endometriosis   . Fibromuscular dysplasia of cervicocranial artery ([place])   . Hypoglycemia   . Internal carotid artery dissection [facility])    saw Dr. Gae Gallop   . Osteoporosis   . Paresthesia of left upper limb    saw Dr. Eddie North   . PONV (postoperative nausea and vomiting)   . Stroke [facility]) 01/17/2013   age 52 dissection of left carotid artery    Patient Active Problem List   Diagnosis Date Noted  . Endometriosis 07/10/2015  . Hypokalemia 10/10/2013  . Neck pain 10/10/2013  . Carotid artery dissection ([place]) 01/30/2013  . Fibromuscular dysplasia ([place]) 01/30/2013  . CHEST PAIN 01/29/2009  . INSOMNIA 01/27/2009  . MOTION SICKNESS 11/15/2008  . ACUTE SINUSITIS, UNSPECIFIED 02/01/2008  . ALLERGIC RHINITIS 02/01/2008  . ASTHMA 02/01/2008  . HEADACHE 02/01/2008  . HYPOGLYCEMIA, HX OF 02/01/2008    Past Surgical History:  Procedure Laterality Date  . APPENDECTOMY    . DILATION AND CURETTAGE OF UTERUS     hysteroscopy  . LAPAROSCOPIC OOPHERECTOMY Left 06/10/2016   per Dr. Everett Graff for endometriosis   . LAPAROSCOPIC OVARIAN CYSTECTOMY Bilateral 07/04/2014   Procedure: LAPAROSCOPIC OVARIAN CYSTECTOMY;   Surgeon: Everett Graff, MD;  Location: [facility];  Service: Gynecology;  Laterality: Bilateral;  . WISDOM TOOTH EXTRACTION      OB History   No obstetric history on file.      Home Medications    Prior to Admission medications   Medication Sig Start Date End Date Taking? Authorizing Provider  aspirin 81 MG tablet Take 81 mg by mouth every evening.    Yes [provider]  azelastine (ASTELIN) 0.1 % nasal spray Place into both nostrils. 11/21/19  Yes [provider]  CALCIUM-VITAMIN D PO Take 1 tablet by mouth 3 (three) times daily.   Yes [provider]  Cholecalciferol 25 MCG (1000 UT) tablet Take 1,000 Units by mouth daily.   Yes [provider]  fexofenadine-pseudoephedrine (ALLEGRA-D) 60-120 MG per tablet Take 1 tablet by mouth daily. 12 hour   Yes [provider]  ibuprofen (ADVIL,MOTRIN) 600 MG tablet 1 po  pc   every 6 hours for 5 days then as needed for pain; first dose 4 p.m. 06/10/16 06/10/16  Yes Powell, [place], PA-C  KLOR-CON M10 10 MEQ tablet TAKE 1 TABLET (10 MEQ TOTAL) BY MOUTH 2 (TWO) TIMES DAILY. 08/07/19  Yes Laurey Morale, MD  levalbuterol [facility] HFA) 45 MCG/ACT inhaler Inhale 1-2 puffs into the lungs every 4 (four) hours as needed for wheezing or  shortness of breath. Reported on 02/18/2015   Yes [provider]  Magnesium 250 MG TABS Take 1 tablet by mouth daily at 12 noon.    Yes [provider]  Multiple Vitamins-Minerals (MULTIVITAMIN,TX-MINERALS) tablet Take 1 tablet by mouth daily at 12 noon.   Yes [provider]  PRESCRIPTION MEDICATION 75 mg daily. Edward Qualia   Yes [provider]  promethazine (PHENERGAN) 25 MG tablet Take 1 tablet (25 mg total) by mouth every 4 (four) hours as needed for nausea or vomiting. 08/17/16  Yes Laurey Morale, MD    Family History Family History  Problem Relation Age of Onset  . Diabetes Father   . Heart disease Father   . Hypertension Father   . Varicose  Veins Father   . Bleeding Disorder Mother   . Varicose Veins Mother   . Hypertension Mother     Social History Social History   Tobacco Use  . Smoking status: Never Smoker  . Smokeless tobacco: Never Used  Vaping Use  . Vaping Use: Never used  Substance Use Topics  . Alcohol use: Yes    Alcohol/week: 0.0 standard drinks    Comment: rare  . Drug use: No     Allergies   Codeine, Cefdinir, Cefuroxime axetil, Other, Red dye, and Strawberry extract   Review of Systems Review of Systems  Musculoskeletal: Positive for arthralgias and joint swelling.  Skin: Positive for color change. Negative for wound.  Neurological: Negative for weakness and numbness.     Physical Exam Triage Vital Signs ED Triage Vitals [02/09/20 1043]  Enc Vitals Group     BP 125/86     Pulse Rate 95     Resp      Temp      Temp src      SpO2 99 %     Weight      Height      Head Circumference      Peak Flow      Pain Score 8     Pain Loc      Pain Edu?      Excl. in [place]?    No data found.  Updated Vital Signs BP 125/86 (BP Location: Right Arm)   Pulse 95   LMP 01/16/2020   SpO2 99%   Visual Acuity Right Eye Distance:   Left Eye Distance:   Bilateral Distance:    Right Eye Near:   Left Eye Near:    Bilateral Near:     Physical Exam Vitals and nursing note reviewed.  Constitutional:      Appearance: She is well-developed and well-nourished.  HENT:     Head: Normocephalic and atraumatic.  Eyes:     Extraocular Movements: EOM normal.  Cardiovascular:     Rate and Rhythm: Normal rate and regular rhythm.     Pulses:          Radial pulses are 2+ on the right side.  Pulmonary:     Effort: Pulmonary effort is normal.  Musculoskeletal:        General: Swelling and tenderness present. Normal range of motion.     Cervical back: Normal range of motion.     Comments: Right wrist: mild edema over styloid process., tenderness. Full ROM, increase pain at end range. Right hand:  tenderness along lateral aspect. Full ROM all fingers Right elbow: non-tender, full ROM  Skin:    General: Skin is warm and dry.     Capillary Refill:  Capillary refill takes less than 2 seconds.     Findings: Bruising (faint over Right styloid process) present.  Neurological:     Mental Status: She is alert and oriented to person, place, and time.     Sensory: No sensory deficit.  Psychiatric:        Mood and Affect: Mood and affect normal.        Behavior: Behavior normal.      UC Treatments / Results  Labs (all labs ordered are listed, but only abnormal results are displayed) Labs Reviewed - No data to display  EKG   Radiology DG Wrist Complete Right  Result Date: 02/09/2020 CLINICAL DATA:  RIGHT hand and wrist pain after hitting wrist on the corner of kitchen console 1 day ago. Pain bruising and swelling along the distal ulnar wrist. EXAM: RIGHT HAND - COMPLETE 3+ VIEW; RIGHT WRIST - COMPLETE 3+ VIEW COMPARISON:  None FINDINGS: Wrist: Mild dorsal translation of the ulna relative to the radius on lateral projections and mild widening of the distal radioulnar joint. Soft tissue swelling in the area. No sign of bony fracture. Carpal bones are intact. Hand: Potential for dorsal translation of the distal radius is more pronounced on the lateral view of the hand and is associated with soft tissue swelling. No sign of bony fracture. IMPRESSION: Question dorsal translation of the ulna relative to the radius with associated soft tissue swelling. Findings raise the question of distal radioulnar joint injury, mechanism would be atypical as mechanism is described. CT may be helpful for further assessment to exclude this possibility. No signs of bony fracture Electronically Signed   By: Zetta Bills M.D.   On: 02/09/2020 11:23   DG Hand Complete Right  Result Date: 02/09/2020 CLINICAL DATA:  RIGHT hand and wrist pain after hitting wrist on the corner of kitchen console 1 day ago. Pain bruising  and swelling along the distal ulnar wrist. EXAM: RIGHT HAND - COMPLETE 3+ VIEW; RIGHT WRIST - COMPLETE 3+ VIEW COMPARISON:  None FINDINGS: Wrist: Mild dorsal translation of the ulna relative to the radius on lateral projections and mild widening of the distal radioulnar joint. Soft tissue swelling in the area. No sign of bony fracture. Carpal bones are intact. Hand: Potential for dorsal translation of the distal radius is more pronounced on the lateral view of the hand and is associated with soft tissue swelling. No sign of bony fracture. IMPRESSION: Question dorsal translation of the ulna relative to the radius with associated soft tissue swelling. Findings raise the question of distal radioulnar joint injury, mechanism would be atypical as mechanism is described. CT may be helpful for further assessment to exclude this possibility. No signs of bony fracture Electronically Signed   By: Zetta Bills M.D.   On: 02/09/2020 11:23    Procedures Procedures (including critical care time)  Medications Ordered in UC Medications - No data to display  Initial Impression / Assessment and Plan / UC Course  I have reviewed the triage vital signs and the nursing notes.  Pertinent labs & imaging results that were available during my care of the patient were reviewed by me and considered in my medical decision making (see chart for details).     Discussed imaging with pt Encouraged to keep wearing wrist splint until f/u with Sports Medicine next week for further evaluation and treatment of symptoms.  Final Clinical Impressions(s) / UC Diagnoses   Final diagnoses:  Right hand pain  Right wrist pain  Right wrist  injury, initial encounter     Discharge Instructions      Call Monday to schedule a follow up appointment with Sports Medicine next week for further evaluation and treatment of your wrist and hand pain. You should continue to wear your wrist splint until follow up with a specialist.       ED Prescriptions    None     PDMP not reviewed this encounter.   Noe Gens, PA-C 02/10/20 1006

## 2020-02-12 ENCOUNTER — Ambulatory Visit: Payer: BC Managed Care – PPO | Admitting: Family Medicine

## 2020-02-12 NOTE — Progress Notes (Deleted)
  [name] - 45 y.o. female MRN [id number]  Date of birth: [birth year]  SUBJECTIVE:  Including CC & ROS.  No chief complaint on file.   [name] is a 45 y.o. female that is  ***.  ***   Review of Systems See HPI   HISTORY: Past Medical, Surgical, Social, and Family History Reviewed & Updated per EMR.   Pertinent Historical Findings include:  Past Medical History:  Diagnosis Date  . Allergy   . Asthma    sees Dr. Harold Hedge   . Chronic headaches    Migraine  . Endometriosis   . Fibromuscular dysplasia of cervicocranial artery ([place])   . Hypoglycemia   . Internal carotid artery dissection [facility])    saw Dr. Gae Gallop   . Osteoporosis   . Paresthesia of left upper limb    saw Dr. Eddie North   . PONV (postoperative nausea and vomiting)   . Stroke [facility]) 01/17/2013   age 14 dissection of left carotid artery    Past Surgical History:  Procedure Laterality Date  . APPENDECTOMY    . DILATION AND CURETTAGE OF UTERUS     hysteroscopy  . LAPAROSCOPIC OOPHERECTOMY Left 06/10/2016   per Dr. Everett Graff for endometriosis   . LAPAROSCOPIC OVARIAN CYSTECTOMY Bilateral 07/04/2014   Procedure: LAPAROSCOPIC OVARIAN CYSTECTOMY;  Surgeon: Everett Graff, MD;  Location: [facility];  Service: Gynecology;  Laterality: Bilateral;  . WISDOM TOOTH EXTRACTION      Family History  Problem Relation Age of Onset  . Diabetes Father   . Heart disease Father   . Hypertension Father   . Varicose Veins Father   . Bleeding Disorder Mother   . Varicose Veins Mother   . Hypertension Mother     Social History   Socioeconomic History  . Marital status: Married    Spouse name: Not on file  . Number of children: Not on file  . Years of education: Not on file  . Highest education level: Not on file  Occupational History  . Not on file  Tobacco Use  . Smoking status: Never Smoker  . Smokeless tobacco: Never Used  Vaping Use  . Vaping Use: Never used  Substance and Sexual Activity   . Alcohol use: Yes    Alcohol/week: 0.0 standard drinks    Comment: rare  . Drug use: No  . Sexual activity: Not on file  Other Topics Concern  . Not on file  Social History Narrative  . Not on file   Social Determinants of Health   Financial Resource Strain: Not on file  Food Insecurity: Not on file  Transportation Needs: Not on file  Physical Activity: Not on file  Stress: Not on file  Social Connections: Not on file  Intimate Partner Violence: Not on file     PHYSICAL EXAM:  VS: LMP 01/16/2020  Physical Exam Gen: NAD, alert, cooperative with exam, well-appearing MSK:  ***      ASSESSMENT & PLAN:   No problem-specific Assessment & Plan notes found for this encounter.

## 2020-02-13 ENCOUNTER — Ambulatory Visit
Admission: RE | Admit: 2020-02-13 | Discharge: 2020-02-13 | Disposition: A | Discharge status: Home / Self Care | Payer: BC Managed Care – PPO | Source: Ambulatory Visit | Attending: Sports Medicine | Admitting: Sports Medicine

## 2020-02-13 ENCOUNTER — Ambulatory Visit: Payer: BC Managed Care – PPO | Admitting: Family Medicine

## 2020-02-13 ENCOUNTER — Other Ambulatory Visit: Payer: Self-pay

## 2020-02-13 VITALS — BP 122/82 | Ht 60.0 in | Wt 100.0 lb

## 2020-02-13 DIAGNOSIS — M25531 Pain in right wrist: Secondary | ICD-10-CM | POA: Diagnosis not present

## 2020-02-13 DIAGNOSIS — S63591D Other specified sprain of right wrist, subsequent encounter: Secondary | ICD-10-CM

## 2020-02-13 NOTE — Assessment & Plan Note (Addendum)
45 yo woman with likely sprain of DRUJ from last Friday, 2/4. X-rays reviewed by me show likely superior and laterally rotated ulna in comparison to radius, no fractures. Since patient has prominent ulnar styloids bilaterally and both are mobile, recommend comparison x-ray of left wrist/forearm to r/o variant anatomical finding. Most likely a contusion or sprain, no further imaging other than comparison radiograph necessary at this time. Gave patient ROM exercises. Recommend continue use of brace to prevent re-injury, ice, NSAIDs alternating with tylenol, rest, and follow up in 2 weeks or sooner if worsening pain or x-ray with new finding.

## 2020-02-13 NOTE — Progress Notes (Addendum)
    SUBJECTIVE:   CHIEF COMPLAINT / HPI: right wrist pain  44 yo woman presents as new patient to [facility] with right wrist pain. She was walking in her house on Friday afternoon, 02/08/20, when she attempted to avoid stepping on a toy and ended up hitting her right forearm/wrist on furniture. She states that she remembered the injury and thought it would leave a bruise. She later noticed bruising on ulnar styloid and on her 4th and 5th metacarpals. She has full ROM of her left wrist, but pain in her wrist and forearm with use. She has been icing her wrist, using ibuprofen, and wearing a wrist brace.  PERTINENT  PMH / PSH: fibromuscular dysplasia, h/o carotid artery dissection  OBJECTIVE:   BP 122/82   Ht 5' (1.524 m)   Wt 100 lb (45.4 kg)   LMP 01/16/2020   BMI 19.53 kg/m   Nursing note and vitals reviewed GEN: age-appropriate, WW, resting comfortably in chair, NAD, WNWD HEENT: NCAT. Sclera without injection or icterus. Wrist, R: Inspection yielded ecchymosis over 4th and 5th metacarpals, more prominent ulnar styloid R>L, mild swelling. ROM full with good flexion and extension and ulnar/radial deviation that is symmetrical with opposite wrist, but painful on Right. Palpation is normal over metacarpals, scaphoid, lunate, and TFCC; Right and left ulnar styloids are very mobile. Strength 4/5 in all directions limited by pain.  Neuro: Alert and at baseline Psych: Pleasant and appropriate  ASSESSMENT/PLAN:   DRUJ (distal radioulnar joint) sprain, right, subsequent encounter 45 yo woman with likely sprain of DRUJ from last Friday, 2/4. X-rays reviewed by me show likely superior and laterally rotated ulna in comparison to radius, no fractures. Since patient has prominent ulnar styloids bilaterally and both are mobile, recommend comparison x-ray of left wrist/forearm to r/o variant anatomical finding. Most likely a contusion or sprain, no further imaging other than comparison radiograph necessary at  this time. Gave patient ROM exercises. Recommend continue use of brace to prevent re-injury, ice, NSAIDs alternating with tylenol, rest, and follow up in 2 weeks or sooner if worsening pain or x-ray with new finding.     Gladys Damme, MD [place]   Resident Attestation   I saw and evaluated the patient, performing the key elements of the service.I  personally performed or re-performed the history, physical exam, and medical decision making activities of this service and have verified that the service and findings are accurately documented in the resident's note. I developed the management plan that is described in the resident's note, and I agree with the content, with my edits above.  Harolyn Rutherford, DO [facility], PGY-4  I was the preceptor for this visit and available for immediate consultation Shellia Cleverly, DO

## 2020-02-13 NOTE — Patient Instructions (Addendum)
It was great to meet you today! Thank you for letting me participate in your care!  Today, we discussed your right wrist pain which thankfully is not broken but you may have injured a ligament in your wrist. I will review the comparison x-rays as soon as they are available and call you with results and what to do next. Continue to wear the brace, you can come out of it to shower and at night, otherwise, keep it on. Ice 4-5 times per day and elevate the wrist. You can continue using Ibuprofen 600mg  and add Tylenol 1000mg  two times per day. You can also use Voltaren gel.  Do some of the range of motion exercises we gave you 1-2 times daily.    Once the x-rays are back I will call you on when to return. If normal I will see you back in a couple of weeks to ensure you are still getting better.  Be well, Harolyn Rutherford, DO PGY-4, Sports Medicine Fellow [place]

## 2020-02-14 ENCOUNTER — Other Ambulatory Visit: Payer: Self-pay

## 2020-02-14 DIAGNOSIS — M25531 Pain in right wrist: Secondary | ICD-10-CM

## 2020-02-19 ENCOUNTER — Ambulatory Visit (INDEPENDENT_AMBULATORY_CARE_PROVIDER_SITE_OTHER): Payer: BC Managed Care – PPO

## 2020-02-19 ENCOUNTER — Other Ambulatory Visit: Payer: Self-pay

## 2020-02-19 DIAGNOSIS — M25431 Effusion, right wrist: Secondary | ICD-10-CM | POA: Diagnosis not present

## 2020-02-19 DIAGNOSIS — R937 Abnormal findings on diagnostic imaging of other parts of musculoskeletal system: Secondary | ICD-10-CM | POA: Diagnosis not present

## 2020-02-19 DIAGNOSIS — M25531 Pain in right wrist: Secondary | ICD-10-CM

## 2020-02-19 DIAGNOSIS — S63071A Subluxation of distal end of right ulna, initial encounter: Secondary | ICD-10-CM | POA: Diagnosis not present

## 2020-02-25 ENCOUNTER — Other Ambulatory Visit: Payer: Self-pay | Admitting: Family Medicine

## 2020-02-25 ENCOUNTER — Telehealth: Payer: Self-pay | Admitting: *Deleted

## 2020-02-25 NOTE — Telephone Encounter (Signed)
Dr Garlan Fillers called pt with CT results. Pt is to see Dr Grandville Silos at [facility] on Tuesday 03/04/20 at 215p. Address is [address] [place], and the phone number is [phone]

## 2020-03-04 DIAGNOSIS — M25531 Pain in right wrist: Secondary | ICD-10-CM | POA: Diagnosis not present

## 2020-03-11 IMAGING — US US THYROID
1 series · 13 of 25 positions shown · non-contrast
Comparison: None.

CLINICAL DATA: Thyroid nodules

EXAM:
THYROID ULTRASOUND
TECHNIQUE: Ultrasound examination of the thyroid gland and adjacent soft
tissues was performed.

[Series 1: us thyroid · 0.05mm/px · 13 of 57 slices shown]
[im 1/57]
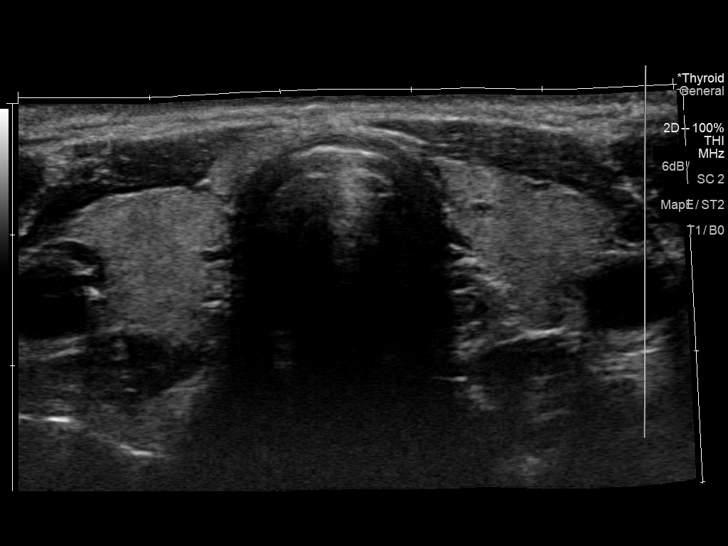
[im 5/57]
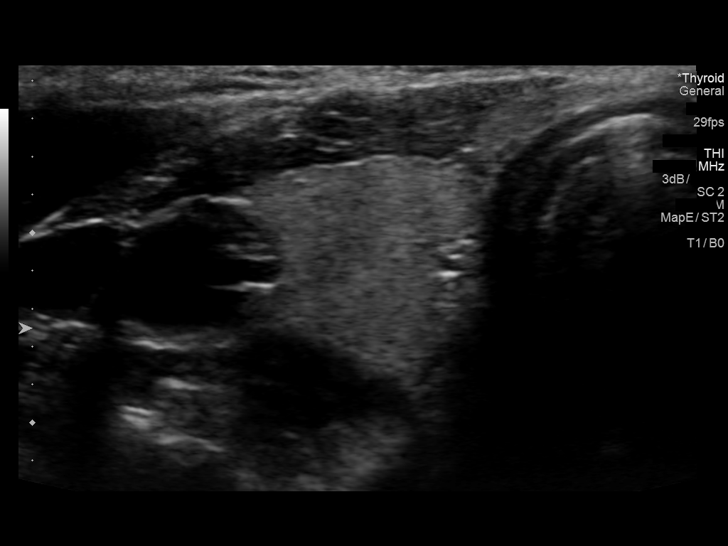
[im 10/57]
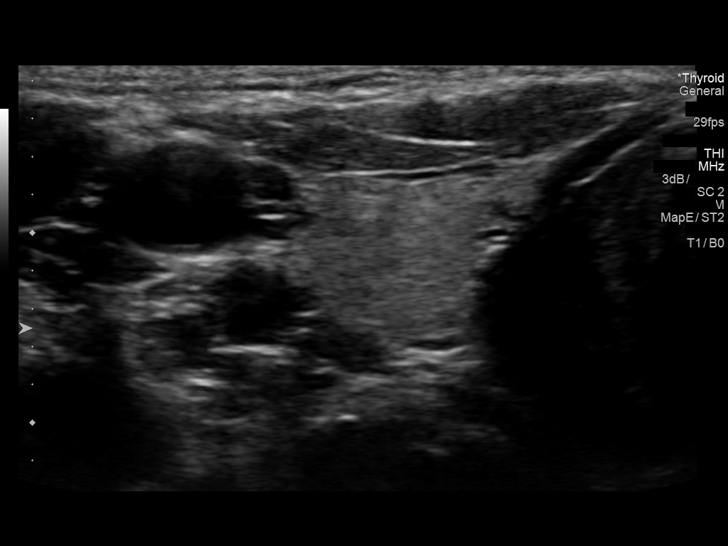
[im 15/57]
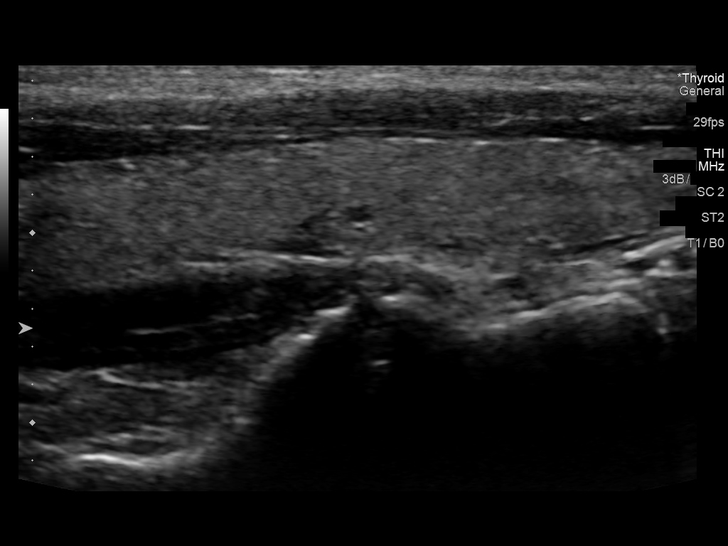
[im 19/57]
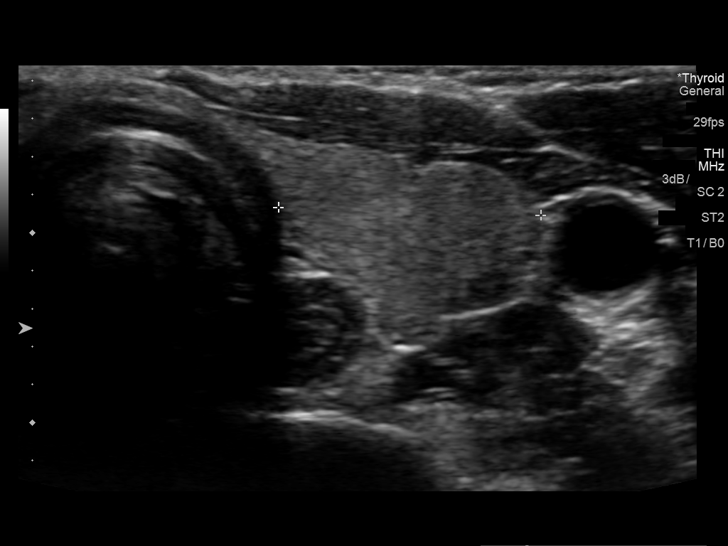
[im 24/57]
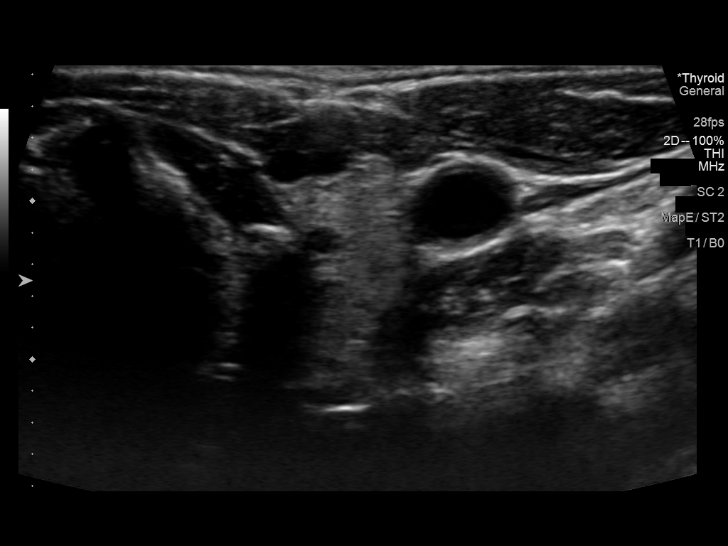
[im 29/57]
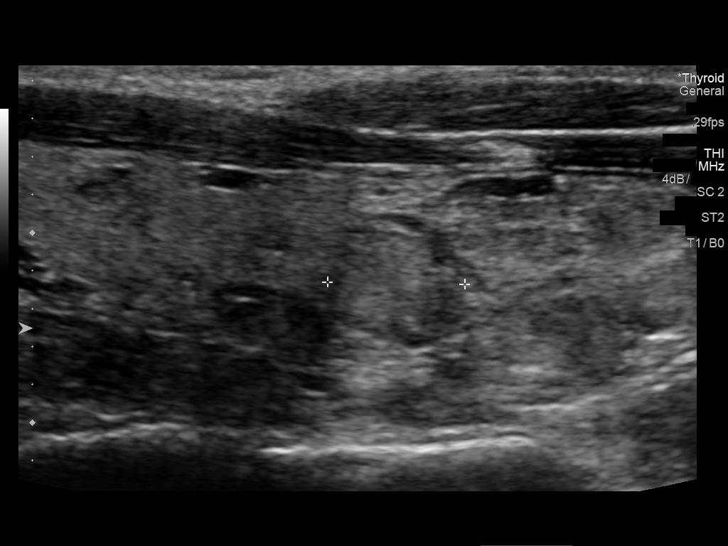
[im 33/57]
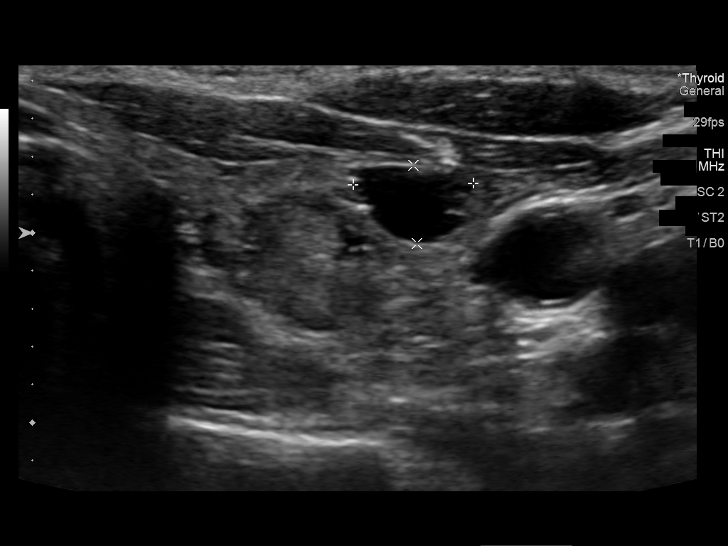
[im 38/57]
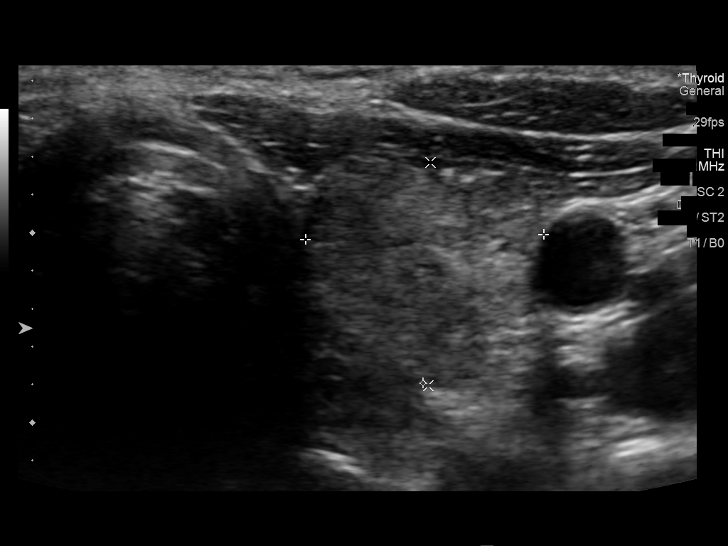
[im 43/57]
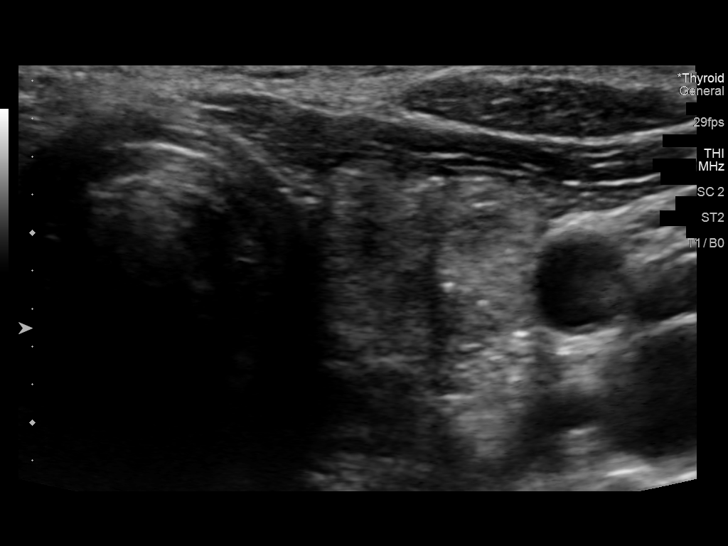
[im 47/57]
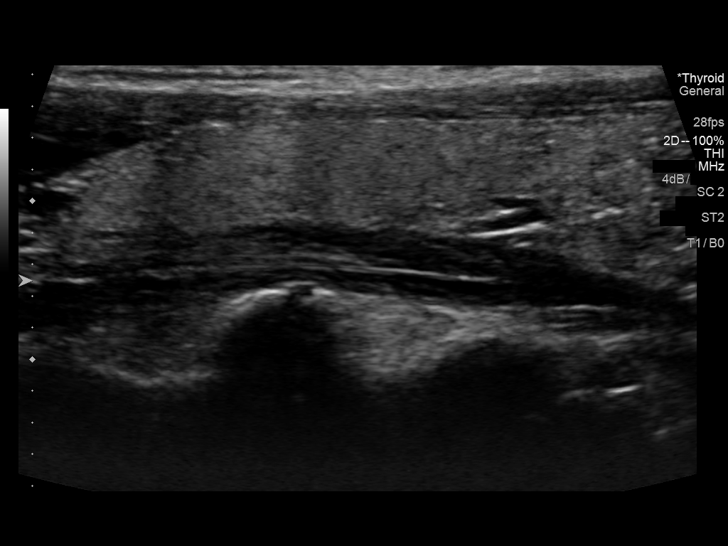
[im 52/57]
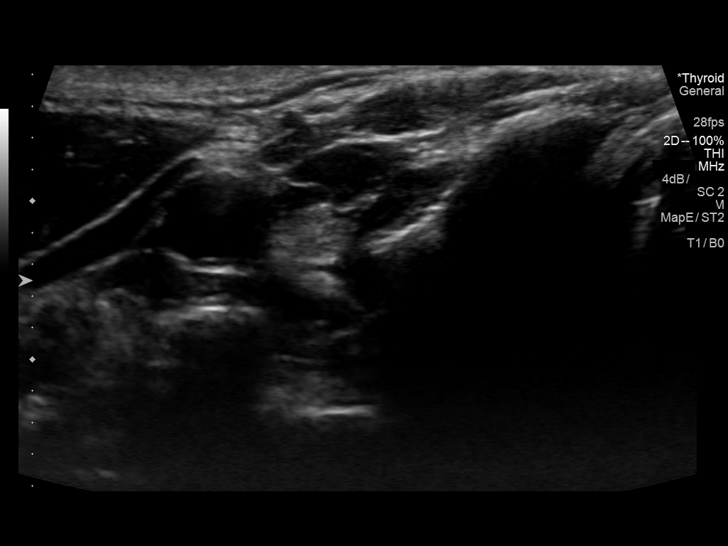
[im 57/57]
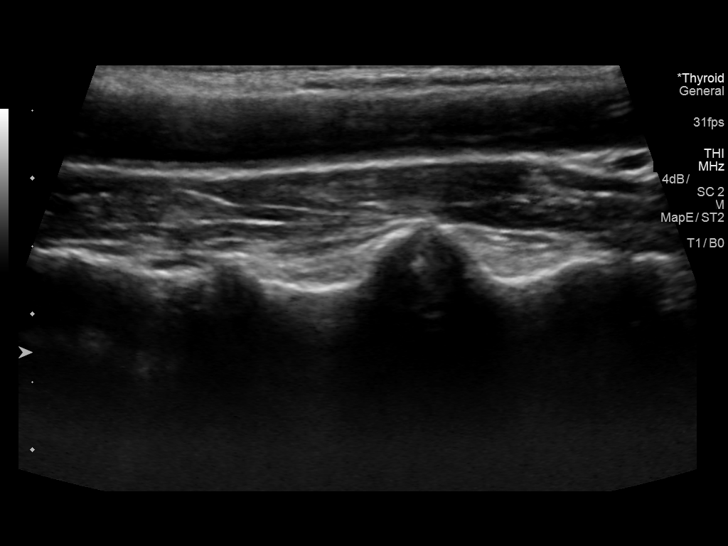

[13 of 25 positions shown; findings below may reference images not displayed]

FINDINGS: Parenchymal Echotexture: Mildly heterogenous

Isthmus: 2 mm

Right lobe: 4.3 x 0.6 x 1.3 cm

Left lobe: 5.8 x 1.5 x 1.4 cm

_________________________________________________________

Estimated total number of nodules >/= 1 cm: 1

Number of spongiform nodules >/=  2 cm not described below (TR1): 0

Number of mixed cystic and solid nodules >/= 1.5 cm not described
below (TR2): 0

_________________________________________________________

Nodule # 1:

Location: Left; Inferior

Maximum size: 0.7 cm; Other 2 dimensions: 0.7 x 0.7 cm

Composition: solid/almost completely solid (2)

Echogenicity: hypoechoic (2)

Shape: not taller-than-wide (0)

Margins: ill-defined (0)

Echogenic foci: none (0)

ACR TI-RADS total points: 4.

ACR TI-RADS risk category: TR4 (4-6 points).

ACR TI-RADS recommendations:

Given size (<0.9 cm) and appearance, this nodule does NOT meet
TI-RADS criteria for biopsy or dedicated follow-up.

_________________________________________________________

Nodule # 3:

Location: Left; Inferior

Maximum size: 1.3 cm; Other 2 dimensions: 1.3 x 1.2 cm

Composition: solid/almost completely solid (2)

Echogenicity: isoechoic (1)

Shape: not taller-than-wide (0)

Margins: ill-defined (0)

Echogenic foci: none (0)

ACR TI-RADS total points: 3.

ACR TI-RADS risk category: TR3 (3 points).

ACR TI-RADS recommendations:

Given size (<1.4 cm) and appearance, this nodule does NOT meet
TI-RADS criteria for biopsy or dedicated follow-up.

_________________________________________________________

There are additional subcentimeter hypoechoic and cystic nodules
noted all measuring 8 mm or less in size. These would not meet
criteria for any biopsy or follow-up.

No adenopathy.
IMPRESSION: Mildly heterogeneous thyroid with dominant nodules noted as above.
These nodules do not meet criteria for biopsy or any additional
follow-up.

The above is in keeping with the ACR TI-RADS recommendations - [HOSPITAL] 5972;[DATE].

## 2020-03-31 DIAGNOSIS — J3089 Other allergic rhinitis: Secondary | ICD-10-CM | POA: Diagnosis not present

## 2020-03-31 DIAGNOSIS — J301 Allergic rhinitis due to pollen: Secondary | ICD-10-CM | POA: Diagnosis not present

## 2020-03-31 DIAGNOSIS — J4520 Mild intermittent asthma, uncomplicated: Secondary | ICD-10-CM | POA: Diagnosis not present

## 2020-03-31 DIAGNOSIS — J3081 Allergic rhinitis due to animal (cat) (dog) hair and dander: Secondary | ICD-10-CM | POA: Diagnosis not present

## 2020-04-08 DIAGNOSIS — M25531 Pain in right wrist: Secondary | ICD-10-CM | POA: Diagnosis not present

## 2020-04-17 DIAGNOSIS — J3089 Other allergic rhinitis: Secondary | ICD-10-CM | POA: Diagnosis not present

## 2020-04-17 DIAGNOSIS — J301 Allergic rhinitis due to pollen: Secondary | ICD-10-CM | POA: Diagnosis not present

## 2020-04-17 DIAGNOSIS — J3081 Allergic rhinitis due to animal (cat) (dog) hair and dander: Secondary | ICD-10-CM | POA: Diagnosis not present

## 2020-04-17 DIAGNOSIS — J4520 Mild intermittent asthma, uncomplicated: Secondary | ICD-10-CM | POA: Diagnosis not present

## 2020-05-02 DIAGNOSIS — K635 Polyp of colon: Secondary | ICD-10-CM | POA: Diagnosis not present

## 2020-05-02 DIAGNOSIS — D120 Benign neoplasm of cecum: Secondary | ICD-10-CM | POA: Diagnosis not present

## 2020-05-02 DIAGNOSIS — Z1211 Encounter for screening for malignant neoplasm of colon: Secondary | ICD-10-CM | POA: Diagnosis not present

## 2020-05-02 HISTORY — PX: COLONOSCOPY: SHX174

## 2020-06-25 DIAGNOSIS — N803 Endometriosis of pelvic peritoneum: Secondary | ICD-10-CM | POA: Diagnosis not present

## 2020-06-25 DIAGNOSIS — Z8673 Personal history of transient ischemic attack (TIA), and cerebral infarction without residual deficits: Secondary | ICD-10-CM | POA: Diagnosis not present

## 2020-08-08 ENCOUNTER — Other Ambulatory Visit: Payer: Self-pay | Admitting: Family Medicine

## 2020-09-22 ENCOUNTER — Telehealth: Payer: Self-pay

## 2020-09-22 NOTE — Telephone Encounter (Signed)
Patient called last week to ask if she could take norethindrone recommended by her GYN to help treat her endometriosis due to history of TIA. Discussed with MD, this is okay. Confirmed with patient and advised I would place a note in the chart.

## 2020-10-09 DIAGNOSIS — Z20822 Contact with and (suspected) exposure to covid-19: Secondary | ICD-10-CM | POA: Diagnosis not present

## 2020-11-19 DIAGNOSIS — M810 Age-related osteoporosis without current pathological fracture: Secondary | ICD-10-CM | POA: Diagnosis not present

## 2020-11-19 DIAGNOSIS — N803 Endometriosis of pelvic peritoneum, unspecified: Secondary | ICD-10-CM | POA: Diagnosis not present

## 2020-11-19 DIAGNOSIS — Z01419 Encounter for gynecological examination (general) (routine) without abnormal findings: Secondary | ICD-10-CM | POA: Diagnosis not present

## 2020-11-19 DIAGNOSIS — N920 Excessive and frequent menstruation with regular cycle: Secondary | ICD-10-CM | POA: Diagnosis not present

## 2020-11-19 DIAGNOSIS — Z1231 Encounter for screening mammogram for malignant neoplasm of breast: Secondary | ICD-10-CM | POA: Diagnosis not present

## 2020-11-19 DIAGNOSIS — Z124 Encounter for screening for malignant neoplasm of cervix: Secondary | ICD-10-CM | POA: Diagnosis not present

## 2020-11-24 DIAGNOSIS — J4520 Mild intermittent asthma, uncomplicated: Secondary | ICD-10-CM | POA: Diagnosis not present

## 2020-11-24 DIAGNOSIS — J3081 Allergic rhinitis due to animal (cat) (dog) hair and dander: Secondary | ICD-10-CM | POA: Diagnosis not present

## 2020-11-24 DIAGNOSIS — J3089 Other allergic rhinitis: Secondary | ICD-10-CM | POA: Diagnosis not present

## 2020-11-24 DIAGNOSIS — J301 Allergic rhinitis due to pollen: Secondary | ICD-10-CM | POA: Diagnosis not present

## 2020-12-15 DIAGNOSIS — Z13820 Encounter for screening for osteoporosis: Secondary | ICD-10-CM | POA: Diagnosis not present

## 2020-12-31 DIAGNOSIS — M810 Age-related osteoporosis without current pathological fracture: Secondary | ICD-10-CM | POA: Diagnosis not present

## 2020-12-31 DIAGNOSIS — N803 Endometriosis of pelvic peritoneum, unspecified: Secondary | ICD-10-CM | POA: Diagnosis not present

## 2021-01-01 ENCOUNTER — Other Ambulatory Visit: Payer: Self-pay | Admitting: *Deleted

## 2021-01-01 DIAGNOSIS — I7771 Dissection of carotid artery: Secondary | ICD-10-CM

## 2021-01-15 ENCOUNTER — Other Ambulatory Visit: Payer: Self-pay

## 2021-01-15 ENCOUNTER — Ambulatory Visit: Payer: BC Managed Care – PPO | Admitting: Vascular Surgery

## 2021-01-15 ENCOUNTER — Ambulatory Visit (HOSPITAL_COMMUNITY)
Admission: RE | Admit: 2021-01-15 | Discharge: 2021-01-15 | Disposition: A | Discharge status: Home / Self Care | Payer: BC Managed Care – PPO | Source: Ambulatory Visit | Attending: Vascular Surgery | Admitting: Vascular Surgery

## 2021-01-15 ENCOUNTER — Encounter: Payer: Self-pay | Admitting: Vascular Surgery

## 2021-01-15 VITALS — BP 117/84 | HR 84 | Temp 98.6°F | Resp 20 | Ht 60.0 in | Wt 101.0 lb

## 2021-01-15 DIAGNOSIS — I7771 Dissection of carotid artery: Secondary | ICD-10-CM | POA: Diagnosis not present

## 2021-01-15 NOTE — Progress Notes (Signed)
REASON FOR VISIT:   History of left ICA dissection and fibromuscular dysplasia of both carotid arteries  MEDICAL ISSUES:   BILATERAL CAROTID DISEASE: This patient has a history of a left internal carotid artery dissection.  Previous images had suggested possible fibromuscular dysplasia bilaterally also.  The velocities suggest 40 to 59% carotid stenoses bilaterally but there is no plaque visualized and this is felt to be related to the fact that her arteries are small and she may have some underlying fibromuscular dysplasia.  Her velocities are stable compared to 2 years ago and have been stable for some time.  For this reason I think it safe to continue her follow-up in 2 years.  I have ordered a follow-up carotid duplex scan in 2 years and we will see her back at that time.  She is on aspirin.  She knows to call sooner if she has problems.   HPI:   [name] is a pleasant 46 y.o. female who I been following after a left internal carotid artery dissection.  She had presented with this in 2015 and was treated with Coumadin.  I last saw her on 01/10/2019.  At that time she had a 40 to 59% right carotid stenosis but no plaque was seen.  On the left side she also had a 40 to 59% stenosis but no plaque was seen.  This was felt fetal related to the size of the artery.  She comes in for follow-up duplex scan.  Of note she is no longer on Coumadin.  This was stopped many years ago.  Today, the patient denies any history of stroke, TIAs, expressive or receptive aphasia, or amaurosis fugax.  She has had some occasional dizziness but attributes this to a new medication that she is on for endometriosis (norethindrone).   There have been no other significant changes to her medical history.   Past Medical History:  Diagnosis Date   Allergy    Asthma    sees Dr. Harold Hedge    Chronic headaches    Migraine   Endometriosis    Fibromuscular dysplasia of cervicocranial artery ([facility])    Hypoglycemia     Internal carotid artery dissection [facility])    saw Dr. Gae Gallop    Osteoporosis    Paresthesia of left upper limb    saw Dr. Eddie North    PONV (postoperative nausea and vomiting)    Stroke [facility]) 01/17/2013   age 27 dissection of left carotid artery    Family History  Problem Relation Age of Onset   Diabetes Father    Heart disease Father    Hypertension Father    Varicose Veins Father    Bleeding Disorder Mother    Varicose Veins Mother    Hypertension Mother     SOCIAL HISTORY: Social History   Tobacco Use   Smoking status: Never   Smokeless tobacco: Never  Substance Use Topics   Alcohol use: Yes    Alcohol/week: 0.0 standard drinks    Comment: rare    Allergies  Allergen Reactions   Codeine Anaphylaxis    Ears swelled up, patient develops severe blisters which cause sloughing of the skin   Cefdinir     REACTION: diarrhea   Cefuroxime Axetil     REACTION: diarrhea   Other     MSG migraine   Red Dye Hives   Strawberry Extract Hives    Current Outpatient Medications  Medication Sig Dispense Refill   aspirin  81 MG tablet Take 81 mg by mouth every evening.      azelastine (ASTELIN) 0.1 % nasal spray Place into both nostrils.     CALCIUM-VITAMIN D PO Take 1 tablet by mouth 3 (three) times daily.     fexofenadine-pseudoephedrine (ALLEGRA-D) 60-120 MG per tablet Take 0.5 tablets by mouth daily. 12 hour     KLOR-CON M10 10 MEQ tablet TAKE 1 TABLET (10 MEQ TOTAL) BY MOUTH 2 (TWO) TIMES DAILY. 180 tablet 3   levalbuterol (XOPENEX HFA) 45 MCG/ACT inhaler Inhale 1-2 puffs into the lungs every 4 (four) hours as needed for wheezing or shortness of breath. Reported on 02/18/2015     Magnesium 250 MG TABS Take 1 tablet by mouth daily at 12 noon.      Multiple Vitamins-Minerals (MULTIVITAMIN,TX-MINERALS) tablet Take 1 tablet by mouth daily at 12 noon.     norethindrone (AYGESTIN) 5 MG tablet Take 10 mg by mouth daily.     PRESCRIPTION MEDICATION 75 mg daily. [place]      promethazine (PHENERGAN) 25 MG tablet Take 1 tablet (25 mg total) by mouth every 4 (four) hours as needed for nausea or vomiting. 60 tablet 2   No current facility-administered medications for this visit.    REVIEW OF SYSTEMS:  [X]  denotes positive finding, [ ]  denotes negative finding Cardiac  Comments:  Chest pain or chest pressure:    Shortness of breath upon exertion:    Short of breath when lying flat:    Irregular heart rhythm:        Vascular    Pain in calf, thigh, or hip brought on by ambulation:    Pain in feet at night that wakes you up from your sleep:     Blood clot in your veins:    Leg swelling:         Pulmonary    Oxygen at home:    Productive cough:     Wheezing:         Neurologic    Sudden weakness in arms or legs:     Sudden numbness in arms or legs:     Sudden onset of difficulty speaking or slurred speech:    Temporary loss of vision in one eye:     Problems with dizziness:         Gastrointestinal    Blood in stool:     Vomited blood:         Genitourinary    Burning when urinating:     Blood in urine:        Psychiatric    Major depression:         Hematologic    Bleeding problems:    Problems with blood clotting too easily:        Skin    Rashes or ulcers:        Constitutional    Fever or chills:     PHYSICAL EXAM:   Vitals:   01/15/21 1039 01/15/21 1041  BP: 113/79 117/84  Pulse: 84   Resp: 20   Temp: 98.6 F (37 C)   SpO2: 99%   Weight: 101 lb (45.8 kg)   Height: 5' (1.524 m)     GENERAL: The patient is a well-nourished female, in no acute distress. The vital signs are documented above. CARDIAC: There is a regular rate and rhythm.  VASCULAR: I do not detect carotid bruits. She has palpable pedal pulses. PULMONARY: There is good air exchange bilaterally without wheezing  or rales. ABDOMEN: Soft and non-tender with normal pitched bowel sounds.  MUSCULOSKELETAL: There are no major deformities or cyanosis. NEUROLOGIC:  No focal weakness or paresthesias are detected. SKIN: There are no ulcers or rashes noted. PSYCHIATRIC: The patient has a normal affect.  DATA:    CAROTID DUPLEX: I have independently interpreted her carotid duplex scan today.  On the right side there is a 40 to 59% stenosis noted based on velocity criteria.  Very small and and the images possibly suggested some underlying fibromuscular dysplasia which has been considered in the past.  On the left side, there was a 40 to 59% stenosis again based on velocity criteria.  Again the images suggested possible fibromuscular dysplasia.  This was not a new finding.  Deitra Mayo [facility] [phone]

## 2021-03-24 ENCOUNTER — Encounter: Payer: Self-pay | Admitting: Family Medicine

## 2021-04-06 DIAGNOSIS — E559 Vitamin D deficiency, unspecified: Secondary | ICD-10-CM | POA: Diagnosis not present

## 2021-04-07 ENCOUNTER — Encounter: Payer: Self-pay | Admitting: Family Medicine

## 2021-04-07 ENCOUNTER — Ambulatory Visit: Payer: BC Managed Care – PPO | Admitting: Family Medicine

## 2021-04-07 VITALS — BP 128/82 | HR 102 | Temp 99.3°F | Wt 103.4 lb

## 2021-04-07 DIAGNOSIS — J40 Bronchitis, not specified as acute or chronic: Secondary | ICD-10-CM | POA: Diagnosis not present

## 2021-04-07 MED ORDER — BENZONATATE 200 MG PO CAPS: 200.0000 mg | ORAL_CAPSULE | Freq: Four times a day (QID) | ORAL | 0 refills | Status: DC | PRN

## 2021-04-07 MED ORDER — ALBUTEROL SULFATE (2.5 MG/3ML) 0.083% IN NEBU: INHALATION_SOLUTION | RESPIRATORY_TRACT | 5 refills | Status: AC

## 2021-04-07 MED ORDER — LEVOFLOXACIN 500 MG PO TABS: 500.0000 mg | ORAL_TABLET | Freq: Every day | ORAL | 0 refills | Status: AC

## 2021-04-07 NOTE — Progress Notes (Signed)
? ?  Subjective:  ? ? Patient ID: [name], female    DOB: 02-24-1975, 46 y.o.   MRN: [id number] ? ?HPI ?Here for one week of chest congestion and coughing up yellow sputum. No chest pain.. she has been wheezing and having some SOB, and she has used her nebulizer a few times. No NVD. Taking Delsym. She tested negative for the Covid virus this morning.  ? ? ?Review of Systems  ?Constitutional:  Positive for fever.  ?HENT:  Positive for congestion. Negative for facial swelling, postnasal drip, sinus pressure and sore throat.   ?Eyes: Negative.   ?Respiratory:  Positive for cough, chest tightness, shortness of breath and wheezing.   ? ?   ?Objective:  ? Physical Exam ?Constitutional:   ?   Comments: Coughing frequently   ?HENT:  ?   Right Ear: Tympanic membrane, ear canal and external ear normal.  ?   Left Ear: Tympanic membrane, ear canal and external ear normal.  ?   Nose: Nose normal.  ?   Mouth/Throat:  ?   Pharynx: Oropharynx is clear.  ?Eyes:  ?   Conjunctiva/sclera: Conjunctivae normal.  ?Pulmonary:  ?   Effort: Pulmonary effort is normal.  ?   Breath sounds: Rhonchi present. No wheezing or rales.  ?Lymphadenopathy:  ?   Cervical: No cervical adenopathy.  ?Neurological:  ?   Mental Status: She is alert.  ? ? ? ? ? ?   ?Assessment & Plan:  ?Bronchitis, treat with 10 days of Levaquin. Add Benzonatate as needed for cough.  ?Alysia Penna, MD ? ? ?

## 2021-04-15 ENCOUNTER — Ambulatory Visit (INDEPENDENT_AMBULATORY_CARE_PROVIDER_SITE_OTHER): Payer: BC Managed Care – PPO | Admitting: Family Medicine

## 2021-04-15 ENCOUNTER — Encounter: Payer: Self-pay | Admitting: Family Medicine

## 2021-04-15 VITALS — BP 108/76 | HR 88 | Temp 98.9°F | Ht 60.0 in | Wt 104.1 lb

## 2021-04-15 DIAGNOSIS — Z0000 Encounter for general adult medical examination without abnormal findings: Secondary | ICD-10-CM | POA: Diagnosis not present

## 2021-04-15 LAB — HEPATIC FUNCTION PANEL
ALT: 12 U/L (ref 0–35)
AST: 16 U/L (ref 0–37)
Albumin: 4.4 g/dL (ref 3.5–5.2)
Alkaline Phosphatase: 44 U/L (ref 39–117)
Bilirubin, Direct: 0.2 mg/dL (ref 0.0–0.3)
Total Bilirubin: 1 mg/dL (ref 0.2–1.2)
Total Protein: 7 g/dL (ref 6.0–8.3)

## 2021-04-15 LAB — CBC WITH DIFFERENTIAL/PLATELET
Basophils Absolute: 0 10*3/uL (ref 0.0–0.1)
Basophils Relative: 1 % (ref 0.0–3.0)
Eosinophils Absolute: 0.1 10*3/uL (ref 0.0–0.7)
Eosinophils Relative: 1.9 % (ref 0.0–5.0)
HCT: 41 % (ref 36.0–46.0)
Hemoglobin: 13.7 g/dL (ref 12.0–15.0)
Lymphocytes Relative: 19.4 % (ref 12.0–46.0)
Lymphs Abs: 1 10*3/uL (ref 0.7–4.0)
MCHC: 33.4 g/dL (ref 30.0–36.0)
MCV: 90.7 fl (ref 78.0–100.0)
Monocytes Absolute: 0.4 10*3/uL (ref 0.1–1.0)
Monocytes Relative: 8.7 % (ref 3.0–12.0)
Neutro Abs: 3.5 10*3/uL (ref 1.4–7.7)
Neutrophils Relative %: 69 % (ref 43.0–77.0)
Platelets: 242 10*3/uL (ref 150.0–400.0)
RBC: 4.52 Mil/uL (ref 3.87–5.11)
RDW: 13.7 % (ref 11.5–15.5)
WBC: 5.1 10*3/uL (ref 4.0–10.5)

## 2021-04-15 LAB — BASIC METABOLIC PANEL
BUN: 14 mg/dL (ref 6–23)
CO2: 28 mEq/L (ref 19–32)
Calcium: 9.3 mg/dL (ref 8.4–10.5)
Chloride: 103 mEq/L (ref 96–112)
Creatinine, Ser: 0.74 mg/dL (ref 0.40–1.20)
GFR: 96.71 mL/min (ref >60.00)
Glucose, Bld: 87 mg/dL (ref 70–99)
Potassium: 4 mEq/L (ref 3.5–5.1)
Sodium: 138 mEq/L (ref 135–145)

## 2021-04-15 LAB — LIPID PANEL
Cholesterol: 135 mg/dL (ref 0–200)
HDL: 63 mg/dL (ref >39.00)
LDL Cholesterol: 64 mg/dL (ref 0–99)
NonHDL: 72.16
Total CHOL/HDL Ratio: 2
Triglycerides: 39 mg/dL (ref 0.0–149.0)
VLDL: 7.8 mg/dL (ref 0.0–40.0)

## 2021-04-15 LAB — TSH: TSH: 1.32 u[IU]/mL (ref 0.35–5.50)

## 2021-04-15 LAB — HEMOGLOBIN A1C: Hgb A1c MFr Bld: 5.9 % (ref 4.6–6.5)

## 2021-04-15 NOTE — Progress Notes (Signed)
? ?  Subjective:  ? ? Patient ID: [name], female    DOB: November 17, 1975, 46 y.o.   MRN: [id number] ? ?HPI ?Here for a well exam. She feels fine. She was here on 04-07-21 with a bronchitis, and she was given a 10 day course of Levaquin. She now feels completely back to normal. She had her yearly GYN check up last week. She saw Dr. Scot Dock in January, and her carotid artery scan showed 40-59% stenoses in both internal carotids (which is stable). He asked to see her again in 2 years. She remains on ASA indefinitely.  ? ? ?Review of Systems  ?Constitutional: Negative.   ?HENT: Negative.    ?Eyes: Negative.   ?Respiratory: Negative.    ?Cardiovascular: Negative.   ?Gastrointestinal: Negative.   ?Genitourinary:  Negative for decreased urine volume, difficulty urinating, dyspareunia, dysuria, enuresis, flank pain, frequency, hematuria, pelvic pain and urgency.  ?Musculoskeletal: Negative.   ?Skin: Negative.   ?Neurological: Negative.  Negative for headaches.  ?Psychiatric/Behavioral: Negative.    ? ?   ?Objective:  ? Physical Exam ?Constitutional:   ?   General: She is not in acute distress. ?   Appearance: Normal appearance. She is well-developed.  ?HENT:  ?   Head: Normocephalic and atraumatic.  ?   Right Ear: External ear normal.  ?   Left Ear: External ear normal.  ?   Nose: Nose normal.  ?   Mouth/Throat:  ?   Pharynx: No oropharyngeal exudate.  ?Eyes:  ?   General: No scleral icterus. ?   Conjunctiva/sclera: Conjunctivae normal.  ?   Pupils: Pupils are equal, round, and reactive to light.  ?Neck:  ?   Thyroid: No thyromegaly.  ?   Vascular: No JVD.  ?Cardiovascular:  ?   Rate and Rhythm: Normal rate and regular rhythm.  ?   Heart sounds: Normal heart sounds. No murmur heard. ?  No friction rub. No gallop.  ?Pulmonary:  ?   Effort: Pulmonary effort is normal. No respiratory distress.  ?   Breath sounds: Normal breath sounds. No wheezing or rales.  ?Chest:  ?   Chest wall: No tenderness.  ?Abdominal:  ?   General: Bowel  sounds are normal. There is no distension.  ?   Palpations: Abdomen is soft. There is no mass.  ?   Tenderness: There is no abdominal tenderness. There is no guarding or rebound.  ?Musculoskeletal:     ?   General: No tenderness. Normal range of motion.  ?   Cervical back: Normal range of motion and neck supple.  ?Lymphadenopathy:  ?   Cervical: No cervical adenopathy.  ?Skin: ?   General: Skin is warm and dry.  ?   Findings: No erythema or rash.  ?Neurological:  ?   Mental Status: She is alert and oriented to person, place, and time.  ?   Cranial Nerves: No cranial nerve deficit.  ?   Motor: No abnormal muscle tone.  ?   Coordination: Coordination normal.  ?   Deep Tendon Reflexes: Reflexes are normal and symmetric. Reflexes normal.  ?Psychiatric:     ?   Behavior: Behavior normal.     ?   Thought Content: Thought content normal.     ?   Judgment: Judgment normal.  ? ? ? ? ? ?   ?Assessment & Plan:  ?Well exam. We discussed diet and exercise. Get fasting labs.  ?Alysia Penna, MD ? ? ?

## 2021-04-21 DIAGNOSIS — J301 Allergic rhinitis due to pollen: Secondary | ICD-10-CM | POA: Diagnosis not present

## 2021-04-21 DIAGNOSIS — J3089 Other allergic rhinitis: Secondary | ICD-10-CM | POA: Diagnosis not present

## 2021-04-21 DIAGNOSIS — J4520 Mild intermittent asthma, uncomplicated: Secondary | ICD-10-CM | POA: Diagnosis not present

## 2021-04-21 DIAGNOSIS — J3081 Allergic rhinitis due to animal (cat) (dog) hair and dander: Secondary | ICD-10-CM | POA: Diagnosis not present

## 2021-07-14 DIAGNOSIS — M810 Age-related osteoporosis without current pathological fracture: Secondary | ICD-10-CM | POA: Diagnosis not present

## 2021-08-15 ENCOUNTER — Other Ambulatory Visit: Payer: Self-pay | Admitting: Family Medicine

## 2021-12-24 DIAGNOSIS — Q782 Osteopetrosis: Secondary | ICD-10-CM | POA: Diagnosis not present

## 2021-12-24 DIAGNOSIS — Z124 Encounter for screening for malignant neoplasm of cervix: Secondary | ICD-10-CM | POA: Diagnosis not present

## 2021-12-24 DIAGNOSIS — N803 Endometriosis of pelvic peritoneum, unspecified: Secondary | ICD-10-CM | POA: Diagnosis not present

## 2021-12-24 DIAGNOSIS — Z01419 Encounter for gynecological examination (general) (routine) without abnormal findings: Secondary | ICD-10-CM | POA: Diagnosis not present

## 2021-12-24 DIAGNOSIS — Z1231 Encounter for screening mammogram for malignant neoplasm of breast: Secondary | ICD-10-CM | POA: Diagnosis not present

## 2021-12-31 ENCOUNTER — Other Ambulatory Visit: Payer: Self-pay | Admitting: Obstetrics and Gynecology

## 2021-12-31 DIAGNOSIS — Q782 Osteopetrosis: Secondary | ICD-10-CM

## 2022-03-11 ENCOUNTER — Other Ambulatory Visit: Payer: Self-pay | Admitting: Obstetrics and Gynecology

## 2022-03-12 ENCOUNTER — Other Ambulatory Visit: Payer: Self-pay | Admitting: Obstetrics and Gynecology

## 2022-03-16 ENCOUNTER — Other Ambulatory Visit: Payer: Self-pay | Admitting: Obstetrics and Gynecology

## 2022-03-18 ENCOUNTER — Ambulatory Visit: Payer: BC Managed Care – PPO | Admitting: Adult Health

## 2022-03-18 VITALS — BP 130/90 | HR 100 | Temp 98.4°F | Ht 60.0 in | Wt 104.0 lb

## 2022-03-18 DIAGNOSIS — J302 Other seasonal allergic rhinitis: Secondary | ICD-10-CM

## 2022-03-18 DIAGNOSIS — R6889 Other general symptoms and signs: Secondary | ICD-10-CM

## 2022-03-18 LAB — POC COVID19 BINAXNOW: SARS Coronavirus 2 Ag: NEGATIVE

## 2022-03-18 LAB — POCT INFLUENZA A/B
Influenza A, POC: NEGATIVE
Influenza B, POC: NEGATIVE

## 2022-03-18 MED ORDER — PREDNISONE 20 MG PO TABS: 20.0000 mg | ORAL_TABLET | Freq: Every day | ORAL | 0 refills | Status: AC

## 2022-03-18 NOTE — Progress Notes (Signed)
Subjective:    Patient ID: [name], female    DOB: [birth year], 47 y.o.   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  She presents to the office today for an acute issue. She reports that her symptoms started about 5 days ago with sinus pressure, headache, itchy watery eyes. Two days ago she developed a semi productive barking cough and has had some mild wheezing.   She denies fevers/chills/Shortness of breath/chest tightness.    She has been taking motrin for headache  For seasonal allergies she is taking Allegra D and and Astelin nasal spray that she gets from her allergist..  She has not had to use her inhaler.    Review of Systems See HPI   Past Medical History:  Diagnosis Date   Allergy    Asthma    sees Dr. Harold Hedge    Chronic headaches    Migraine   Endometriosis    Fibromuscular dysplasia of cervicocranial artery (HCC)    Hypoglycemia    Internal carotid artery dissection [facility])    saw Dr. Gae Gallop    Osteoporosis    Paresthesia of left upper limb    saw Dr. Eddie North    PONV (postoperative nausea and vomiting)    Stroke [facility]) 01/17/2013   age 20 dissection of left carotid artery    Social History   Socioeconomic History   Marital status: Married    Spouse name: Not on file   Number of children: Not on file   Years of education: Not on file   Highest education level: Some college, no degree  Occupational History   Not on file  Tobacco Use   Smoking status: Never   Smokeless tobacco: Never  Vaping Use   Vaping Use: Never used  Substance and Sexual Activity   Alcohol use: Yes    Alcohol/week: 0.0 standard drinks of alcohol     Comment: rare   Drug use: No   Sexual activity: Not on file  Other Topics Concern   Not on file  Social History Narrative   Not on file   Social Determinants of Health   Financial Resource Strain: Patient Declined (03/18/2022)   Overall Financial Resource Strain (CARDIA)    Difficulty of Paying Living Expenses: Patient declined  Food Insecurity: Patient Declined (03/18/2022)   Hunger Vital Sign    Worried About Running Out of Food in the Last Year: Patient declined    [place] in the Last Year: Patient declined  Transportation Needs: No Transportation Needs (03/18/2022)   PRAPARE - Hydrologist (Medical): No    Lack of Transportation (Non-Medical): No  Physical Activity: Insufficiently Active (03/18/2022)   Exercise Vital Sign    Days of Exercise per Week: 4 days    Minutes of Exercise per Session: 30 min  Stress: Not on file  Social Connections: Unknown (03/18/2022)   Social Connection and Isolation Panel [NHANES]    Frequency of Communication with Friends and Family: More than three times a week    Frequency of Social Gatherings with Friends and Family: More than three times a week    Attends Religious Services: More than 4 times per year    Active  Member of Clubs or Organizations: Yes    Attends Archivist Meetings: More than 4 times per year    Marital Status: Not on file  Intimate Partner Violence: Not on file    Past Surgical History:  Procedure Laterality Date   APPENDECTOMY     COLONOSCOPY  05/02/2020   per Dr. Collene Mares, benign polyp, repeat in 10 yrs   [place]     hysteroscopy   LAPAROSCOPIC OOPHERECTOMY Left 06/10/2016   per Dr. Everett Graff for endometriosis    LAPAROSCOPIC OVARIAN CYSTECTOMY Bilateral 07/04/2014   Procedure: LAPAROSCOPIC OVARIAN CYSTECTOMY;  Surgeon: Everett Graff, MD;  Location: [facility];  Service: Gynecology;  Laterality: Bilateral;   WISDOM TOOTH EXTRACTION      Family  History  Problem Relation Age of Onset   Diabetes Father    Heart disease Father    Hypertension Father    Varicose Veins Father    Bleeding Disorder Mother    Varicose Veins Mother    Hypertension Mother     Allergies  Allergen Reactions   Codeine Anaphylaxis    Ears swelled up, patient develops severe blisters which cause sloughing of the skin   Cefdinir     REACTION: diarrhea   Cefuroxime Axetil     REACTION: diarrhea   Other     MSG migraine   Red Dye Hives   Strawberry Extract Hives    Current Outpatient Medications on File Prior to Visit  Medication Sig Dispense Refill   albuterol (PROVENTIL) (2.5 MG/3ML) 0.083% nebulizer solution 3 ml as needed 75 mL 5   aspirin 81 MG tablet Take 81 mg by mouth every evening.      azelastine (ASTELIN) 0.1 % nasal spray Place into both nostrils.     benzonatate (TESSALON) 200 MG capsule Take 1 capsule (200 mg total) by mouth every 6 (six) hours as needed for cough. 60 capsule 0   CALCIUM-VITAMIN D PO Take 1 tablet by mouth 3 (three) times daily.     fexofenadine-pseudoephedrine (ALLEGRA-D) 60-120 MG per tablet Take 0.5 tablets by mouth daily. 12 hour     KLOR-CON M10 10 MEQ tablet TAKE 1 TABLET BY MOUTH 2 TIMES DAILY. 180 tablet 3   levalbuterol (XOPENEX HFA) 45 MCG/ACT inhaler Inhale 1-2 puffs into the lungs every 4 (four) hours as needed for wheezing or shortness of breath. Reported on 02/18/2015     norethindrone (AYGESTIN) 5 MG tablet Take 10 mg by mouth daily.     PRESCRIPTION MEDICATION 75 mg daily. [place]     promethazine (PHENERGAN) 25 MG tablet Take 1 tablet (25 mg total) by mouth every 4 (four) hours as needed for nausea or vomiting. 60 tablet 2   No current facility-administered medications on file prior to visit.    BP (!) 130/90   Pulse 100   Temp 98.4 F (36.9 C) (Oral)   Ht 5' (1.524 m)   Wt 104 lb (47.2 kg)   SpO2 99%   BMI 20.31 kg/m       Objective:   Physical Exam Vitals and nursing note reviewed.   Constitutional:      Appearance: Normal appearance.  HENT:     Right Ear: A middle ear effusion is present. Tympanic membrane is not erythematous.     Left Ear: A middle ear effusion is present. Tympanic membrane is not erythematous.     Nose: Congestion present. No rhinorrhea.     Right Turbinates: Enlarged.  Left Turbinates: Enlarged.     Right Sinus: Maxillary sinus tenderness present. No frontal sinus tenderness.     Left Sinus: Maxillary sinus tenderness present. No frontal sinus tenderness.  Cardiovascular:     Rate and Rhythm: Normal rate and regular rhythm.     Pulses: Normal pulses.     Heart sounds: Normal heart sounds.  Pulmonary:     Effort: Pulmonary effort is normal.     Breath sounds: Normal breath sounds. No stridor. No wheezing or rhonchi.  Neurological:     General: No focal deficit present.     Mental Status: She is alert and oriented to person, place, and time.  Psychiatric:        Mood and Affect: Mood normal.        Behavior: Behavior normal.        Thought Content: Thought content normal.        Judgment: Judgment normal.        Assessment & Plan:  1. Seasonal allergies - Appears most seasonal allergy mediated. No signs of bacterial infection. Will prescribe prednisone 20 mg x 7 days.  - Follow up if not improving in the next 2-3 days  - predniSONE (DELTASONE) 20 MG tablet; Take 1 tablet (20 mg total) by mouth daily with breakfast for 7 days.  Dispense: 7 tablet; Refill: 0  2. Flu-like symptoms  - POC COVID-19 BinaxNow- negative - POCT Influenza A/B- negative  Dorothyann Peng, NP'

## 2022-04-05 NOTE — Progress Notes (Signed)
Surgical Instructions    Your procedure is scheduled on Monday, 04/12/22.  Report to [facility] Main Entrance "A" at 10:20 A.M., then check in with the Admitting office.  Call this number if you have problems the morning of surgery:  [phone]   If you have any questions prior to your surgery date call [phone]: Open Monday-Friday 8am-4pm If you experience any cold or flu symptoms such as cough, fever, chills, shortness of breath, etc. between now and your scheduled surgery, please notify us at the above number     Remember:  Do not eat after midnight the night before your surgery  You may drink clear liquids until 9:20am the morning of your surgery.   Clear liquids allowed are: Water, Non-Citrus Juices (without pulp), Carbonated Beverages, Clear Tea, Black Coffee ONLY (NO MILK, CREAM OR POWDERED CREAMER of any kind), and Gatorade    Take these medicines the morning of surgery with A SIP OF WATER:  azelastine (ASTELIN)  Elagolix Sodium (ORILISSA)  norethindrone (AYGESTIN)   IF NEEDED: albuterol nebulizer or inhaler- bring inhaler with you the day of surgery dextromethorphan (DELSYM)  Eye drops  As of today, STOP taking any Aspirin (unless otherwise instructed by your surgeon) Aleve, Naproxen, Ibuprofen, Motrin, Advil, Goody's, BC's, all herbal medications, fish oil, and all vitamins.           Do not wear jewelry or makeup. Do not wear lotions, powders, perfumes or deodorant. Do not shave 48 hours prior to surgery.   Do not bring valuables to the hospital. Do not wear nail polish, gel polish, artificial nails, or any other type of covering on natural nails (fingers and toes) If you have artificial nails or gel coating that need to be removed by a nail salon, please have this removed prior to surgery. Artificial nails or gel coating may interfere with anesthesia's ability to adequately monitor your vital signs.  [facility] is not responsible for any belongings or valuables.     Do NOT Smoke (Tobacco/Vaping)  24 hours prior to your procedure  If you use a CPAP at night, you may bring your mask for your overnight stay.   Contacts, glasses, hearing aids, dentures or partials may not be worn into surgery, please bring cases for these belongings   For patients admitted to the hospital, discharge time will be determined by your treatment team.   Patients discharged the day of surgery will not be allowed to drive home, and someone needs to stay with them for 24 hours.   SURGICAL WAITING ROOM VISITATION Patients having surgery or a procedure may have no more than 2 support people in the waiting area - these visitors may rotate.   Children under the age of 85 must have an adult with them who is not the patient. If the patient needs to stay at the hospital during part of their recovery, the visitor guidelines for inpatient rooms apply. Pre-op nurse will coordinate an appropriate time for 1 support person to accompany patient in pre-op.  This support person may not rotate.   Please refer to [email or web address] for the visitor guidelines for Inpatients (after your surgery is over and you are in a regular room).    Special instructions:    Oral Hygiene is also important to reduce your risk of infection.  Remember - BRUSH YOUR TEETH THE MORNING OF SURGERY WITH YOUR REGULAR TOOTHPASTE   [place]- Preparing For Surgery  Before surgery, you can play an important role. Because skin is  not sterile, your skin needs to be as free of germs as possible. You can reduce the number of germs on your skin by washing with CHG (chlorahexidine gluconate) Soap before surgery.  CHG is an antiseptic cleaner which kills germs and bonds with the skin to continue killing germs even after washing.     Please do not use if you have an allergy to CHG or antibacterial soaps. If your skin becomes reddened/irritated stop using the CHG.  Do not  shave (including legs and underarms) for at least 48 hours prior to first CHG shower. It is OK to shave your face.  Please follow these instructions carefully.     Shower the NIGHT BEFORE SURGERY and the MORNING OF SURGERY with CHG Soap.   If you chose to wash your hair, wash your hair first as usual with your normal shampoo. After you shampoo, rinse your hair and body thoroughly to remove the shampoo.  Then [employer] and genitals (private parts) with your normal soap and rinse thoroughly to remove soap.  After that Use CHG Soap as you would any other liquid soap. You can apply CHG directly to the skin and wash gently with a scrungie or a clean washcloth.   Apply the CHG Soap to your body ONLY FROM THE NECK DOWN.  Do not use on open wounds or open sores. Avoid contact with your eyes, ears, mouth and genitals (private parts). Wash Face and genitals (private parts)  with your normal soap.   Wash thoroughly, paying special attention to the area where your surgery will be performed.  Thoroughly rinse your body with warm water from the neck down.  DO NOT shower/wash with your normal soap after using and rinsing off the CHG Soap.  Pat yourself dry with a CLEAN TOWEL.  Wear CLEAN PAJAMAS to bed the night before surgery  Place CLEAN SHEETS on your bed the night before your surgery  DO NOT SLEEP WITH PETS.   Day of Surgery: Take a shower with CHG soap. Wear Clean/Comfortable clothing the morning of surgery Do not apply any deodorants/lotions.   Remember to brush your teeth WITH YOUR REGULAR TOOTHPASTE.    If you received a COVID test during your pre-op visit, it is requested that you wear a mask when out in public, stay away from anyone that may not be feeling well, and notify your surgeon if you develop symptoms. If you have been in contact with anyone that has tested positive in the last 10 days, please notify your surgeon.    Please read over the following fact sheets that you were  given.

## 2022-04-06 ENCOUNTER — Other Ambulatory Visit: Payer: Self-pay

## 2022-04-06 ENCOUNTER — Encounter (HOSPITAL_COMMUNITY)
Admission: RE | Admit: 2022-04-06 | Discharge: 2022-04-06 | Disposition: A | Discharge status: Home / Self Care | Payer: BC Managed Care – PPO | Source: Ambulatory Visit | Attending: Obstetrics and Gynecology | Admitting: Obstetrics and Gynecology

## 2022-04-06 ENCOUNTER — Encounter (HOSPITAL_COMMUNITY): Payer: Self-pay

## 2022-04-06 VITALS — BP 140/96 | HR 111 | Temp 98.4°F | Resp 16 | Ht 60.0 in | Wt 105.8 lb

## 2022-04-06 DIAGNOSIS — Z01818 Encounter for other preprocedural examination: Secondary | ICD-10-CM | POA: Insufficient documentation

## 2022-04-06 DIAGNOSIS — I517 Cardiomegaly: Secondary | ICD-10-CM | POA: Diagnosis not present

## 2022-04-06 DIAGNOSIS — Z793 Long term (current) use of hormonal contraceptives: Secondary | ICD-10-CM | POA: Insufficient documentation

## 2022-04-06 DIAGNOSIS — Z7901 Long term (current) use of anticoagulants: Secondary | ICD-10-CM | POA: Insufficient documentation

## 2022-04-06 DIAGNOSIS — J45909 Unspecified asthma, uncomplicated: Secondary | ICD-10-CM | POA: Insufficient documentation

## 2022-04-06 DIAGNOSIS — Z8673 Personal history of transient ischemic attack (TIA), and cerebral infarction without residual deficits: Secondary | ICD-10-CM | POA: Insufficient documentation

## 2022-04-06 DIAGNOSIS — R000 Tachycardia, unspecified: Secondary | ICD-10-CM | POA: Diagnosis not present

## 2022-04-06 DIAGNOSIS — N803 Endometriosis of pelvic peritoneum, unspecified: Secondary | ICD-10-CM | POA: Diagnosis not present

## 2022-04-06 HISTORY — DX: Family history of other specified conditions: Z84.89

## 2022-04-06 LAB — CBC
HCT: 42.8 % (ref 36.0–46.0)
Hemoglobin: 14.6 g/dL (ref 12.0–15.0)
MCH: 31.7 pg (ref 26.0–34.0)
MCHC: 34.1 g/dL (ref 30.0–36.0)
MCV: 92.8 fL (ref 80.0–100.0)
Platelets: 200 10*3/uL (ref 150–400)
RBC: 4.61 MIL/uL (ref 3.87–5.11)
RDW: 12.2 % (ref 11.5–15.5)
WBC: 4.4 10*3/uL (ref 4.0–10.5)
nRBC: 0 % (ref 0.0–0.2)

## 2022-04-06 LAB — TYPE AND SCREEN
ABO/RH(D): O POS
Antibody Screen: NEGATIVE

## 2022-04-06 LAB — BASIC METABOLIC PANEL
Anion gap: 11 (ref 5–15)
BUN: 7 mg/dL (ref 6–20)
CO2: 25 mmol/L (ref 22–32)
Calcium: 9.1 mg/dL (ref 8.9–10.3)
Chloride: 104 mmol/L (ref 98–111)
Creatinine, Ser: 0.73 mg/dL (ref 0.44–1.00)
GFR, Estimated: >60 mL/min (ref >60)
Glucose, Bld: 121 mg/dL — ABNORMAL HIGH (ref 70–99)
Potassium: 3.7 mmol/L (ref 3.5–5.1)
Sodium: 140 mmol/L (ref 135–145)

## 2022-04-06 NOTE — Progress Notes (Signed)
PCP - Alysia Penna, MD Cardiologist - Denies Vascular: Deitra Mayo   PPM/ICD - Denies  Chest x-ray - Denies EKG - 04/06/2022 Stress Test - Denies ECHO - 01/17/2013 Cardiac Cath - Denies  Sleep Study - Denies  DM: Denies  Blood Thinner Instructions: N/A Aspirin Instructions: Patient instructed to contact surgeons office and/or vascular office regarding stopping baby aspirin.  ERAS Protcol - NPO PRE-SURGERY Ensure or G2- N/A  COVID TEST- N/A   Anesthesia review: Yes, history of TIA.  Patient denies shortness of breath, fever, cough and chest pain at PAT appointment   All instructions explained to the patient, with a verbal understanding of the material. Patient agrees to go over the instructions while at home for a better understanding.The opportunity to ask questions was provided.

## 2022-04-06 NOTE — Progress Notes (Signed)
Surgical Instructions    Your procedure is scheduled on Monday, 04/12/22.  Report to [facility] Main Entrance "A" at 10:20 A.M., then check in with the Admitting office.  Call this number if you have problems the morning of surgery:  [phone]   If you have any questions prior to your surgery date call [phone]: Open Monday-Friday 8am-4pm If you experience any cold or flu symptoms such as cough, fever, chills, shortness of breath, etc. between now and your scheduled surgery, please notify us at the above number     Remember:  Do not eat or Drink after midnight the night before your surgery   Take these medicines the morning of surgery with A SIP OF WATER:  azelastine (ASTELIN)  Elagolix Sodium (ORILISSA)  norethindrone (AYGESTIN)   IF NEEDED: albuterol nebulizer or inhaler- bring inhaler with you the day of surgery dextromethorphan (DELSYM)  Eye drops  As of today, STOP taking any Aspirin (unless otherwise instructed by your surgeon) Aleve, Naproxen, Ibuprofen, Motrin, Advil, Goody's, BC's, all herbal medications, fish oil, and all vitamins.           Do not wear jewelry or makeup. Do not wear lotions, powders, perfumes or deodorant. Do not shave 48 hours prior to surgery.   Do not bring valuables to the hospital. Do not wear nail polish, gel polish, artificial nails, or any other type of covering on natural nails (fingers and toes) If you have artificial nails or gel coating that need to be removed by a nail salon, please have this removed prior to surgery. Artificial nails or gel coating may interfere with anesthesia's ability to adequately monitor your vital signs.  [facility] is not responsible for any belongings or valuables.    Do NOT Smoke (Tobacco/Vaping)  24 hours prior to your procedure  If you use a CPAP at night, you may bring your mask for your overnight stay.   Contacts, glasses, hearing aids, dentures or partials may not be worn into surgery, please bring  cases for these belongings   For patients admitted to the hospital, discharge time will be determined by your treatment team.   Patients discharged the day of surgery will not be allowed to drive home, and someone needs to stay with them for 24 hours.   SURGICAL WAITING ROOM VISITATION Patients having surgery or a procedure may have no more than 2 support people in the waiting area - these visitors may rotate.   Children under the age of 23 must have an adult with them who is not the patient. If the patient needs to stay at the hospital during part of their recovery, the visitor guidelines for inpatient rooms apply. Pre-op nurse will coordinate an appropriate time for 1 support person to accompany patient in pre-op.  This support person may not rotate.   Please refer to [email or web address] for the visitor guidelines for Inpatients (after your surgery is over and you are in a regular room).    Special instructions:    Oral Hygiene is also important to reduce your risk of infection.  Remember - BRUSH YOUR TEETH THE MORNING OF SURGERY WITH YOUR REGULAR TOOTHPASTE   [place]- Preparing For Surgery  Before surgery, you can play an important role. Because skin is not sterile, your skin needs to be as free of germs as possible. You can reduce the number of germs on your skin by washing with CHG (chlorahexidine gluconate) Soap before surgery.  CHG is an antiseptic cleaner which kills germs  and bonds with the skin to continue killing germs even after washing.     Please do not use if you have an allergy to CHG or antibacterial soaps. If your skin becomes reddened/irritated stop using the CHG.  Do not shave (including legs and underarms) for at least 48 hours prior to first CHG shower. It is OK to shave your face.  Please follow these instructions carefully.     Shower the NIGHT BEFORE SURGERY and the MORNING OF SURGERY with CHG Soap.   If  you chose to wash your hair, wash your hair first as usual with your normal shampoo. After you shampoo, rinse your hair and body thoroughly to remove the shampoo.  Then [employer] and genitals (private parts) with your normal soap and rinse thoroughly to remove soap.  After that Use CHG Soap as you would any other liquid soap. You can apply CHG directly to the skin and wash gently with a scrungie or a clean washcloth.   Apply the CHG Soap to your body ONLY FROM THE NECK DOWN.  Do not use on open wounds or open sores. Avoid contact with your eyes, ears, mouth and genitals (private parts). Wash Face and genitals (private parts)  with your normal soap.   Wash thoroughly, paying special attention to the area where your surgery will be performed.  Thoroughly rinse your body with warm water from the neck down.  DO NOT shower/wash with your normal soap after using and rinsing off the CHG Soap.  Pat yourself dry with a CLEAN TOWEL.  Wear CLEAN PAJAMAS to bed the night before surgery  Place CLEAN SHEETS on your bed the night before your surgery  DO NOT SLEEP WITH PETS.   Day of Surgery: Take a shower with CHG soap. Wear Clean/Comfortable clothing the morning of surgery Do not apply any deodorants/lotions.   Remember to brush your teeth WITH YOUR REGULAR TOOTHPASTE.    If you received a COVID test during your pre-op visit, it is requested that you wear a mask when out in public, stay away from anyone that may not be feeling well, and notify your surgeon if you develop symptoms. If you have been in contact with anyone that has tested positive in the last 10 days, please notify your surgeon.    Please read over the following fact sheets that you were given.

## 2022-04-07 LAB — RPR: RPR Ser Ql: NONREACTIVE

## 2022-04-07 NOTE — Progress Notes (Signed)
Anesthesia Chart Review:  Case: [id number] Date/Time: 04/12/22 1205   Procedures:      TOTAL LAPAROSCOPIC HYSTERECTOMY WITH BILATERAL SALPINGO OOPHORECTOMY (Right)     LAPAROSCOPIC BILATERAL SALPINGECTOMY (Bilateral)   Anesthesia type: General   Pre-op diagnosis: ENDOMETRIOSIS OF PELVIC PERITONEUM,UNSPECIFIED   Location: [facility] ROOM 08 / [place]   Surgeons: Everett Graff, MD       DISCUSSION: Patient is a 47 year old scheduled for the above procedure.  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  Last vascular surgery follow-up with Dr. Scot Dock is from 01/15/21 for routine 2 year follow-up. Patient has not been on warfarin for many years. Follow-up carotid duplex showed 40-59% BICA stenosis and findings suggestive of possible fibromuscular dysplasia which was not a new finding.  Noted that she was evaluated by Dorothyann Peng, NP on 03/18/22 for 5 days of allergy and/or flu-like symptoms with sinus pressure, watery eyes, cough and mild wheezing. Denied fever. COVID and flu testing negative. He did not suspect bacterial infection. She was treated with 1 week of prednisone.  She will be one month from symptoms onset by her surgery date. I called and spoke with her. She had felt recovered since her March symptoms but went to [facility] and developed recurrent allergy symptoms, and used her albuterol nebulizer on 04/06/22 and also on 04/07/22 AM in hopes to resolve any symptoms. She normally uses her nebulizer only as needed. She feels she is improving. She says these symptoms are typical after allergan exposure and usually resolve within a couple of days. She is not having fever or congestion or other sick symptoms. She apparently had a visit with Dr. Mancel Bale on 04/06/22. I discussed that should her symptoms  persistent then she should be seen by PCP if indicated and should let Dr. Mancel Bale know, as surgery may need to be delayed if concern for on-going reactive airway or sick symptoms. For now, she feels symptoms are typical for allergies that seem to be improving. She was not coughing and no audible wheezing noted during our phone call.   Anesthesia team to evaluate on the day of surgery.   VS: BP (!) 140/96   Pulse (!) 111   Temp 36.9 C   Resp 16   Ht 5' (1.524 m)   Wt 48 kg   LMP 12/04/2020   SpO2 100%   BMI 20.66 kg/m  EKG: ST at 101 bpm   PROVIDERS: Laurey Morale, MD is PCP  Deitra Mayo, MD is vascular surgeon  Harold Hedge, Herbie Baltimore, MD is allergist   LABS: Labs reviewed: Acceptable for surgery. (all labs ordered are listed, but only abnormal results are displayed)  Labs Reviewed  BASIC METABOLIC PANEL - Abnormal; Notable for the following components:      Result Value   Glucose, Bld 121 (*)    All other components within normal limits  CBC  RPR  TYPE AND SCREEN    EKG: 04/06/22: Sinus tachycardia at 101 bpm Possible Left atrial enlargement Borderline ECG When compared with ECG of 02-Jun-2016 11:05, Sinus tachycardia is new Confirmed by Berniece Salines [phone]) on 04/06/2022 7:52:02 PM   CV: US Carotid 01/15/21: Summary:  Right Carotid: Velocities in the right mid ICA are consistent with a 40-59% stenosis consistent with FMD.  Left Carotid: Velocities in the left mid ICA are consistent with a 40-59% stenosis consistent  with FMD.    Past Medical History:  Diagnosis Date   Allergy    Asthma    sees Dr. Harold Hedge    Chronic headaches    Migraine   Endometriosis    Family history of adverse reaction to anesthesia    Sister has post operative nausea and vomiting.   Fibromuscular dysplasia of cervicocranial artery    Hypoglycemia    Internal carotid artery dissection    saw Dr. Gae Gallop    Osteoporosis    Paresthesia of left upper limb    saw Dr.  Eddie North    PONV (postoperative nausea and vomiting)    Stroke 01/17/2013   age 27 dissection of left carotid artery    Past Surgical History:  Procedure Laterality Date   APPENDECTOMY     COLONOSCOPY  05/02/2020   per Dr. Collene Mares, benign polyp, repeat in 10 yrs   [place]     hysteroscopy   LAPAROSCOPIC OOPHERECTOMY Left 06/10/2016   per Dr. Everett Graff for endometriosis    LAPAROSCOPIC OVARIAN CYSTECTOMY Bilateral 07/04/2014   Procedure: LAPAROSCOPIC OVARIAN CYSTECTOMY;  Surgeon: Everett Graff, MD;  Location: [facility];  Service: Gynecology;  Laterality: Bilateral;   WISDOM TOOTH EXTRACTION      MEDICATIONS:  benzonatate (TESSALON) 200 MG capsule   albuterol (PROVENTIL) (2.5 MG/3ML) 0.083% nebulizer solution   albuterol (VENTOLIN HFA) 108 (90 Base) MCG/ACT inhaler   aspirin 81 MG tablet   azelastine (ASTELIN) 0.1 % nasal spray   dextromethorphan (DELSYM) 30 MG/5ML liquid   Elagolix Sodium (ORILISSA) 150 MG TABS   ELDERBERRY-VITAMIN C-ZINC PO   fexofenadine-pseudoephedrine (ALLEGRA-D) 60-120 MG per tablet   ibuprofen (ADVIL) 200 MG tablet   KLOR-CON M10 10 MEQ tablet   MAGNESIUM PO   Multiple Vitamin (CALCIUM COMPLEX PO)   norethindrone (AYGESTIN) 5 MG tablet   Omega-3 Fatty Acids (OMEGA 3 PO)   OVER THE COUNTER MEDICATION   Soft Lens Products (B & L SENSITIVE EYES SALINE) 0.4 % SOLN   No current facility-administered medications for this encounter.    Myra Gianotti, PA-C Surgical Short Stay/Anesthesiology [facility] Phone [phone] [facility] Phone [phone] 04/07/2022 6:11 PM

## 2022-04-12 ENCOUNTER — Encounter (HOSPITAL_COMMUNITY): Payer: Self-pay | Admitting: Obstetrics and Gynecology

## 2022-04-12 ENCOUNTER — Ambulatory Visit (HOSPITAL_COMMUNITY)
Admission: RE | Admit: 2022-04-12 | Discharge: 2022-04-13 | Disposition: A | Discharge status: Home / Self Care | Payer: BC Managed Care – PPO | Attending: Obstetrics and Gynecology | Admitting: Obstetrics and Gynecology

## 2022-04-12 ENCOUNTER — Ambulatory Visit (HOSPITAL_COMMUNITY): Payer: BC Managed Care – PPO | Admitting: Vascular Surgery

## 2022-04-12 ENCOUNTER — Other Ambulatory Visit: Payer: Self-pay

## 2022-04-12 ENCOUNTER — Encounter (HOSPITAL_COMMUNITY)
Admission: RE | Disposition: A | Discharge status: Home / Self Care | Payer: Self-pay | Source: Home / Self Care | Attending: Obstetrics and Gynecology

## 2022-04-12 ENCOUNTER — Ambulatory Visit (HOSPITAL_COMMUNITY): Payer: BC Managed Care – PPO | Admitting: Anesthesiology

## 2022-04-12 DIAGNOSIS — N8000 Endometriosis of the uterus, unspecified: Secondary | ICD-10-CM | POA: Insufficient documentation

## 2022-04-12 DIAGNOSIS — Z79899 Other long term (current) drug therapy: Secondary | ICD-10-CM | POA: Insufficient documentation

## 2022-04-12 DIAGNOSIS — Z7982 Long term (current) use of aspirin: Secondary | ICD-10-CM | POA: Insufficient documentation

## 2022-04-12 DIAGNOSIS — N879 Dysplasia of cervix uteri, unspecified: Secondary | ICD-10-CM | POA: Diagnosis not present

## 2022-04-12 DIAGNOSIS — N736 Female pelvic peritoneal adhesions (postinfective): Secondary | ICD-10-CM | POA: Insufficient documentation

## 2022-04-12 DIAGNOSIS — J45909 Unspecified asthma, uncomplicated: Secondary | ICD-10-CM | POA: Diagnosis not present

## 2022-04-12 DIAGNOSIS — N72 Inflammatory disease of cervix uteri: Secondary | ICD-10-CM | POA: Insufficient documentation

## 2022-04-12 DIAGNOSIS — R102 Pelvic and perineal pain: Secondary | ICD-10-CM | POA: Diagnosis not present

## 2022-04-12 DIAGNOSIS — N809 Endometriosis, unspecified: Secondary | ICD-10-CM | POA: Diagnosis present

## 2022-04-12 DIAGNOSIS — D259 Leiomyoma of uterus, unspecified: Secondary | ICD-10-CM | POA: Diagnosis not present

## 2022-04-12 DIAGNOSIS — N803 Endometriosis of pelvic peritoneum, unspecified: Secondary | ICD-10-CM | POA: Diagnosis not present

## 2022-04-12 DIAGNOSIS — D251 Intramural leiomyoma of uterus: Secondary | ICD-10-CM | POA: Diagnosis not present

## 2022-04-12 DIAGNOSIS — Z793 Long term (current) use of hormonal contraceptives: Secondary | ICD-10-CM | POA: Insufficient documentation

## 2022-04-12 HISTORY — PX: PROCTOSCOPY: SHX2266

## 2022-04-12 HISTORY — PX: TOTAL LAPAROSCOPIC HYSTERECTOMY WITH BILATERAL SALPINGO OOPHORECTOMY: SHX6845

## 2022-04-12 HISTORY — PX: CYSTOSCOPY: SHX5120

## 2022-04-12 LAB — ABO/RH: ABO/RH(D): O POS

## 2022-04-12 LAB — POCT PREGNANCY, URINE: Preg Test, Ur: NEGATIVE

## 2022-04-12 SURGERY — HYSTERECTOMY, TOTAL, LAPAROSCOPIC, WITH BILATERAL SALPINGO-OOPHORECTOMY
Anesthesia: General | Site: Bladder | Laterality: Right

## 2022-04-12 MED ORDER — SODIUM CHLORIDE 0.9% FLUSH: 9.0000 mL | INTRAVENOUS | Status: DC | PRN

## 2022-04-12 MED ORDER — KETOROLAC TROMETHAMINE 30 MG/ML IJ SOLN
INTRAMUSCULAR | Status: DC | PRN
  Administered 2022-04-12: 30 mg via INTRAVENOUS

## 2022-04-12 MED ORDER — LIDOCAINE 2% (20 MG/ML) 5 ML SYRINGE
INTRAMUSCULAR | Status: DC | PRN
  Administered 2022-04-12: 50 mg via INTRAVENOUS

## 2022-04-12 MED ORDER — HYDROMORPHONE HCL 1 MG/ML IJ SOLN
0.2500 mg | INTRAMUSCULAR | Status: DC | PRN
  Administered 2022-04-12: 0.5 mg via INTRAVENOUS

## 2022-04-12 MED ORDER — EPHEDRINE 5 MG/ML INJ
INTRAVENOUS | Status: AC
  Filled 2022-04-12: qty 5

## 2022-04-12 MED ORDER — SUGAMMADEX SODIUM 200 MG/2ML IV SOLN
INTRAVENOUS | Status: DC | PRN
  Administered 2022-04-12: 200 mg via INTRAVENOUS

## 2022-04-12 MED ORDER — FENTANYL CITRATE (PF) 250 MCG/5ML IJ SOLN
INTRAMUSCULAR | Status: AC
  Filled 2022-04-12: qty 5

## 2022-04-12 MED ORDER — PHENYLEPHRINE 80 MCG/ML (10ML) SYRINGE FOR IV PUSH (FOR BLOOD PRESSURE SUPPORT)
PREFILLED_SYRINGE | INTRAVENOUS | Status: AC
  Filled 2022-04-12: qty 10

## 2022-04-12 MED ORDER — AMISULPRIDE (ANTIEMETIC) 5 MG/2ML IV SOLN: 10.0000 mg | Freq: Once | INTRAVENOUS | Status: DC | PRN

## 2022-04-12 MED ORDER — LACTATED RINGERS IV SOLN: INTRAVENOUS | Status: DC

## 2022-04-12 MED ORDER — CLINDAMYCIN PHOSPHATE 900 MG/50ML IV SOLN: 900.0000 mg | INTRAVENOUS | Status: DC

## 2022-04-12 MED ORDER — HEMOSTATIC AGENTS (NO CHARGE) OPTIME
TOPICAL | Status: DC | PRN
  Administered 2022-04-12: 1

## 2022-04-12 MED ORDER — NALOXONE HCL 0.4 MG/ML IJ SOLN: 0.4000 mg | INTRAMUSCULAR | Status: DC | PRN

## 2022-04-12 MED ORDER — PROMETHAZINE HCL 25 MG/ML IJ SOLN: 6.2500 mg | INTRAMUSCULAR | Status: DC | PRN

## 2022-04-12 MED ORDER — ROCURONIUM BROMIDE 10 MG/ML (PF) SYRINGE
PREFILLED_SYRINGE | INTRAVENOUS | Status: AC
  Filled 2022-04-12: qty 10

## 2022-04-12 MED ORDER — IBUPROFEN 600 MG PO TABS: 600.0000 mg | ORAL_TABLET | Freq: Four times a day (QID) | ORAL | Status: DC

## 2022-04-12 MED ORDER — FENTANYL 50 MCG/ML IV PCA SOLN
INTRAVENOUS | Status: DC
  Administered 2022-04-12: 50 ug via INTRAVENOUS
  Administered 2022-04-13: 20 ug via INTRAVENOUS
  Administered 2022-04-13: 50 ug via INTRAVENOUS
  Filled 2022-04-12: qty 25

## 2022-04-12 MED ORDER — OXYCODONE HCL 5 MG PO TABS: 5.0000 mg | ORAL_TABLET | Freq: Once | ORAL | Status: DC | PRN

## 2022-04-12 MED ORDER — PHENYLEPHRINE 80 MCG/ML (10ML) SYRINGE FOR IV PUSH (FOR BLOOD PRESSURE SUPPORT)
PREFILLED_SYRINGE | INTRAVENOUS | Status: DC | PRN
  Administered 2022-04-12: 80 ug via INTRAVENOUS
  Administered 2022-04-12: 160 ug via INTRAVENOUS
  Administered 2022-04-12 (×3): 80 ug via INTRAVENOUS

## 2022-04-12 MED ORDER — SODIUM CHLORIDE 0.9 % IR SOLN
Status: DC | PRN
  Administered 2022-04-12: 1000 mL

## 2022-04-12 MED ORDER — HYDROMORPHONE HCL 1 MG/ML IJ SOLN
INTRAMUSCULAR | Status: AC
  Filled 2022-04-12: qty 1

## 2022-04-12 MED ORDER — POVIDONE-IODINE 10 % EX SWAB: 2.0000 | Freq: Once | CUTANEOUS | Status: DC

## 2022-04-12 MED ORDER — SCOPOLAMINE 1 MG/3DAYS TD PT72
MEDICATED_PATCH | TRANSDERMAL | Status: DC | PRN
  Administered 2022-04-12: 1 via TRANSDERMAL

## 2022-04-12 MED ORDER — LIDOCAINE 2% (20 MG/ML) 5 ML SYRINGE
INTRAMUSCULAR | Status: AC
  Filled 2022-04-12: qty 5

## 2022-04-12 MED ORDER — ONDANSETRON HCL 4 MG/2ML IJ SOLN
INTRAMUSCULAR | Status: DC | PRN
  Administered 2022-04-12: 4 mg via INTRAVENOUS

## 2022-04-12 MED ORDER — SODIUM CHLORIDE 0.9 % IV SOLN
INTRAVENOUS | Status: AC
  Administered 2022-04-12: 50 mL
  Filled 2022-04-12: qty 6

## 2022-04-12 MED ORDER — BUPIVACAINE HCL (PF) 0.25 % IJ SOLN
INTRAMUSCULAR | Status: DC | PRN
  Administered 2022-04-12: 18 mL

## 2022-04-12 MED ORDER — KETOROLAC TROMETHAMINE 30 MG/ML IJ SOLN
30.0000 mg | Freq: Four times a day (QID) | INTRAMUSCULAR | Status: AC
  Administered 2022-04-12 – 2022-04-13 (×3): 30 mg via INTRAVENOUS
  Filled 2022-04-12 (×3): qty 1

## 2022-04-12 MED ORDER — MIDAZOLAM HCL 2 MG/2ML IJ SOLN
INTRAMUSCULAR | Status: DC | PRN
  Administered 2022-04-12: 2 mg via INTRAVENOUS

## 2022-04-12 MED ORDER — DEXAMETHASONE SODIUM PHOSPHATE 10 MG/ML IJ SOLN
INTRAMUSCULAR | Status: AC
  Filled 2022-04-12: qty 1

## 2022-04-12 MED ORDER — EPHEDRINE SULFATE-NACL 50-0.9 MG/10ML-% IV SOSY
PREFILLED_SYRINGE | INTRAVENOUS | Status: DC | PRN
  Administered 2022-04-12: 5 mg via INTRAVENOUS

## 2022-04-12 MED ORDER — SCOPOLAMINE 1 MG/3DAYS TD PT72
MEDICATED_PATCH | TRANSDERMAL | Status: AC
  Filled 2022-04-12: qty 1

## 2022-04-12 MED ORDER — CHLORHEXIDINE GLUCONATE 0.12 % MT SOLN
15.0000 mL | Freq: Once | OROMUCOSAL | Status: AC
  Administered 2022-04-12: 15 mL via OROMUCOSAL
  Filled 2022-04-12: qty 15

## 2022-04-12 MED ORDER — ALBUMIN HUMAN 5 % IV SOLN: INTRAVENOUS | Status: DC | PRN

## 2022-04-12 MED ORDER — DEXAMETHASONE SODIUM PHOSPHATE 10 MG/ML IJ SOLN
INTRAMUSCULAR | Status: DC | PRN
  Administered 2022-04-12: 10 mg via INTRAVENOUS

## 2022-04-12 MED ORDER — ONDANSETRON HCL 4 MG/2ML IJ SOLN
INTRAMUSCULAR | Status: AC
  Filled 2022-04-12: qty 2

## 2022-04-12 MED ORDER — CLINDAMYCIN PHOSPHATE 900 MG/50ML IV SOLN
INTRAVENOUS | Status: AC
  Filled 2022-04-12: qty 50

## 2022-04-12 MED ORDER — GENTAMICIN SULFATE 40 MG/ML IJ SOLN
5.0000 mg/kg | INTRAVENOUS | Status: AC
  Administered 2022-04-12: 240 mg via INTRAVENOUS
  Filled 2022-04-12 (×4): qty 6

## 2022-04-12 MED ORDER — MEPERIDINE HCL 25 MG/ML IJ SOLN: 6.2500 mg | INTRAMUSCULAR | Status: DC | PRN

## 2022-04-12 MED ORDER — ONDANSETRON HCL 4 MG/2ML IJ SOLN: 4.0000 mg | Freq: Four times a day (QID) | INTRAMUSCULAR | Status: DC | PRN

## 2022-04-12 MED ORDER — ORAL CARE MOUTH RINSE: 15.0000 mL | Freq: Once | OROMUCOSAL | Status: AC

## 2022-04-12 MED ORDER — PROPOFOL 10 MG/ML IV BOLUS
INTRAVENOUS | Status: DC | PRN
  Administered 2022-04-12: 100 mg via INTRAVENOUS

## 2022-04-12 MED ORDER — LACTATED RINGERS IV SOLN: INTRAVENOUS | Status: DC | PRN

## 2022-04-12 MED ORDER — OXYCODONE HCL 5 MG PO TABS
5.0000 mg | ORAL_TABLET | ORAL | Status: DC | PRN
  Administered 2022-04-13: 5 mg via ORAL
  Filled 2022-04-12: qty 1

## 2022-04-12 MED ORDER — FENTANYL CITRATE (PF) 250 MCG/5ML IJ SOLN
INTRAMUSCULAR | Status: DC | PRN
  Administered 2022-04-12: 100 ug via INTRAVENOUS
  Administered 2022-04-12 (×3): 50 ug via INTRAVENOUS

## 2022-04-12 MED ORDER — PHENYLEPHRINE HCL-NACL 20-0.9 MG/250ML-% IV SOLN
INTRAVENOUS | Status: DC | PRN
  Administered 2022-04-12: 30 ug/min via INTRAVENOUS

## 2022-04-12 MED ORDER — ROCURONIUM BROMIDE 10 MG/ML (PF) SYRINGE
PREFILLED_SYRINGE | INTRAVENOUS | Status: DC | PRN
  Administered 2022-04-12: 50 mg via INTRAVENOUS
  Administered 2022-04-12: 10 mg via INTRAVENOUS
  Administered 2022-04-12: 20 mg via INTRAVENOUS
  Administered 2022-04-12: 30 mg via INTRAVENOUS

## 2022-04-12 MED ORDER — ACETAMINOPHEN 325 MG PO TABS
650.0000 mg | ORAL_TABLET | ORAL | Status: DC | PRN
  Administered 2022-04-12: 650 mg via ORAL
  Filled 2022-04-12: qty 2

## 2022-04-12 MED ORDER — ACETAMINOPHEN 325 MG PO TABS: 650.0000 mg | ORAL_TABLET | Freq: Once | ORAL | Status: DC

## 2022-04-12 MED ORDER — BUPIVACAINE HCL (PF) 0.25 % IJ SOLN
INTRAMUSCULAR | Status: AC
  Filled 2022-04-12: qty 30

## 2022-04-12 MED ORDER — MIDAZOLAM HCL 2 MG/2ML IJ SOLN
INTRAMUSCULAR | Status: AC
  Filled 2022-04-12: qty 2

## 2022-04-12 MED ORDER — SODIUM CHLORIDE 0.9 % IV SOLN: INTRAVENOUS | Status: DC

## 2022-04-12 MED ORDER — KETOROLAC TROMETHAMINE 30 MG/ML IJ SOLN
INTRAMUSCULAR | Status: AC
  Filled 2022-04-12: qty 2

## 2022-04-12 SURGICAL SUPPLY — 76 items
ADH SKN CLS APL DERMABOND .7 (GAUZE/BANDAGES/DRESSINGS) ×4
APL SRG 38 LTWT LNG FL B (MISCELLANEOUS) ×4
APPLICATOR ARISTA FLEXITIP XL (MISCELLANEOUS) ×1 IMPLANT
BARRIER ADHS 3X4 INTERCEED (GAUZE/BANDAGES/DRESSINGS) IMPLANT
BRR ADH 4X3 ABS CNTRL BYND (GAUZE/BANDAGES/DRESSINGS)
CABLE HIGH FREQUENCY MONO STRZ (ELECTRODE) IMPLANT
COVER MAYO STAND STRL (DRAPES) ×4 IMPLANT
DERMABOND ADVANCED .7 DNX12 (GAUZE/BANDAGES/DRESSINGS) ×4 IMPLANT
DISSECTOR BLUNT TIP ENDO 5MM (MISCELLANEOUS) IMPLANT
DRAPE SURG IRRIG POUCH 19X23 (DRAPES) ×1 IMPLANT
DRSG OPSITE POSTOP 3X4 (GAUZE/BANDAGES/DRESSINGS) IMPLANT
DURAPREP 26ML APPLICATOR (WOUND CARE) ×4 IMPLANT
FILTER SMOKE EVAC LAPAROSHD (FILTER) ×3 IMPLANT
GAUZE PACKING 1INX5YD STRL (GAUZE/BANDAGES/DRESSINGS) ×1 IMPLANT
GLOVE BIO SURGEON STRL SZ7.5 (GLOVE) ×8 IMPLANT
GLOVE BIOGEL PI IND STRL 7.0 (GLOVE) ×8 IMPLANT
GLOVE BIOGEL PI IND STRL 7.5 (GLOVE) ×9 IMPLANT
GLOVE SURG ENC MOIS LTX SZ7.5 (GLOVE) ×4 IMPLANT
GLOVE SURG UNDER LTX SZ7.5 (GLOVE) ×8 IMPLANT
GLOVE SURG UNDER POLY LF SZ7 (GLOVE) ×12 IMPLANT
GOWN STRL REUS W/ TWL LRG LVL3 (GOWN DISPOSABLE) ×12 IMPLANT
GOWN STRL REUS W/ TWL XL LVL3 (GOWN DISPOSABLE) ×2 IMPLANT
GOWN STRL REUS W/TWL LRG LVL3 (GOWN DISPOSABLE) ×12
GOWN STRL REUS W/TWL XL LVL3 (GOWN DISPOSABLE) ×8
HEMOSTAT ARISTA ABSORB 3G PWDR (HEMOSTASIS) ×1 IMPLANT
HEMOSTAT SURGICEL 2X14 (HEMOSTASIS) IMPLANT
HIBICLENS CHG 4% 4OZ BTL (MISCELLANEOUS) ×5 IMPLANT
IRRIG SUCT STRYKERFLOW 2 WTIP (MISCELLANEOUS) ×4
IRRIGATION SUCT STRKRFLW 2 WTP (MISCELLANEOUS) ×4 IMPLANT
KIT PINK PAD W/HEAD ARE REST (MISCELLANEOUS) ×4
KIT PINK PAD W/HEAD ARM REST (MISCELLANEOUS) ×4 IMPLANT
KIT SIGMOIDOSCOPE (SET/KITS/TRAYS/PACK) ×1 IMPLANT
KIT TURNOVER KIT B (KITS) ×4 IMPLANT
LIGASURE LAP L-HOOKWIRE 5 44CM (INSTRUMENTS) ×1 IMPLANT
LIGASURE VESSEL 5MM BLUNT TIP (ELECTROSURGICAL) IMPLANT
MANIPULATOR ADVINCU DEL 2.5 PL (MISCELLANEOUS) ×1 IMPLANT
MANIPULATOR ADVINCU DEL 3.0 PL (MISCELLANEOUS) IMPLANT
MANIPULATOR ADVINCU DEL 3.5 PL (MISCELLANEOUS) IMPLANT
MANIPULATOR ADVINCU DEL 4.0 PL (MISCELLANEOUS) IMPLANT
NDL INSUFFLATION 14GA 120MM (NEEDLE) ×3 IMPLANT
NEEDLE INSUFFLATION 14GA 120MM (NEEDLE) IMPLANT
NS IRRIG 1000ML POUR BTL (IV SOLUTION) ×4 IMPLANT
OCCLUDER COLPOPNEUMO (BALLOONS) ×3 IMPLANT
PACK LAPAROSCOPY BASIN (CUSTOM PROCEDURE TRAY) ×4 IMPLANT
PROTECTOR NERVE ULNAR (MISCELLANEOUS) ×6 IMPLANT
SCISSORS LAP 5X35 DISP (ENDOMECHANICALS) ×4 IMPLANT
SET CYSTO W/LG BORE CLAMP LF (SET/KITS/TRAYS/PACK) ×4 IMPLANT
SET TRI-LUMEN FLTR TB AIRSEAL (TUBING) ×1 IMPLANT
SET TUBE SMOKE EVAC HIGH FLOW (TUBING) ×3 IMPLANT
SHEARS HARMONIC ACE PLUS 36CM (ENDOMECHANICALS) IMPLANT
SLEEVE ADV FIXATION 5X100MM (TROCAR) ×4 IMPLANT
SOL ELECTROSURG ANTI STICK (MISCELLANEOUS)
SOLUTION ELECTROSURG ANTI STCK (MISCELLANEOUS) IMPLANT
SUT MNCRL AB 3-0 PS2 27 (SUTURE) ×7 IMPLANT
SUT MNCRL AB 4-0 PS2 18 (SUTURE) ×1 IMPLANT
SUT PDS AB 1 CT1 36 (SUTURE) ×5 IMPLANT
SUT VIC AB 2-0 SH 27 (SUTURE) ×8
SUT VIC AB 2-0 SH 27X BRD (SUTURE) ×2 IMPLANT
SUT VICRYL 0 ENDOLOOP (SUTURE) IMPLANT
SUT VICRYL 0 TIES 12 18 (SUTURE) IMPLANT
SUT VICRYL 0 UR6 27IN ABS (SUTURE) ×8 IMPLANT
SYR 50ML LL SCALE MARK (SYRINGE) ×4 IMPLANT
SYS BAG RETRIEVAL 10MM (BASKET)
SYSTEM BAG RETRIEVAL 10MM (BASKET) IMPLANT
TIP UTERINE 5.1X6CM LAV DISP (MISCELLANEOUS) IMPLANT
TIP UTERINE 6.7X10CM GRN DISP (MISCELLANEOUS) IMPLANT
TIP UTERINE 6.7X6CM WHT DISP (MISCELLANEOUS) IMPLANT
TIP UTERINE 6.7X8CM BLUE DISP (MISCELLANEOUS) IMPLANT
TOWEL GREEN STERILE FF (TOWEL DISPOSABLE) ×7 IMPLANT
TRAY FOLEY W/BAG SLVR 14FR (SET/KITS/TRAYS/PACK) ×4 IMPLANT
TROCAR ADV FIXATION 11X100MM (TROCAR) ×8 IMPLANT
TROCAR ADV FIXATION 5X100MM (TROCAR) ×4 IMPLANT
TROCAR BALLN 12MMX100 BLUNT (TROCAR) IMPLANT
TROCAR PORT AIRSEAL 5X120 (TROCAR) ×1 IMPLANT
UNDERPAD 30X36 HEAVY ABSORB (UNDERPADS AND DIAPERS) ×4 IMPLANT
WARMER LAPAROSCOPE (MISCELLANEOUS) ×4 IMPLANT

## 2022-04-12 NOTE — H&P (Signed)
[name] is an 47 y.o. female. Pt known to me.  She has been medically managed for her endometriosis pain for many years and has now decided to proceed with definitive mgmt.  Pertinent Gynecological History: Pt has been taking orilissa and norethindrone.  LMP 12/2020 with occasional BTB.  Menstrual History:  Patient's last menstrual period was 12/04/2020.    Past Medical History:  Diagnosis Date   Allergy    Asthma    sees Dr. Irena Cords    Chronic headaches    Migraine   Endometriosis    Family history of adverse reaction to anesthesia    Sister has post operative nausea and vomiting.   Fibromuscular dysplasia of cervicocranial artery    Hypoglycemia    Internal carotid artery dissection    saw Dr. Cari Caraway    Osteoporosis    Paresthesia of left upper limb    saw Dr. Kathrine Haddock    PONV (postoperative nausea and vomiting)    Stroke 01/17/2013   age 69 dissection of left carotid artery    Past Surgical History:  Procedure Laterality Date   APPENDECTOMY     COLONOSCOPY  05/02/2020   per Dr. Loreta Ave, benign polyp, repeat in 10 yrs   DILATION AND CURETTAGE OF UTERUS     hysteroscopy   LAPAROSCOPIC OOPHERECTOMY Left 06/10/2016   per Dr. Osborn Coho for endometriosis    LAPAROSCOPIC OVARIAN CYSTECTOMY Bilateral 07/04/2014   Procedure: LAPAROSCOPIC OVARIAN CYSTECTOMY;  Surgeon: Osborn Coho, MD;  Location: [facility];  Service: Gynecology;  Laterality: Bilateral;   WISDOM TOOTH EXTRACTION      Family History  Problem Relation Age of Onset   Diabetes Father    Heart disease Father    Hypertension Father    Varicose Veins Father    Bleeding Disorder Mother    Varicose Veins Mother    Hypertension Mother     Social History:  reports that she has never smoked. She has never used smokeless tobacco. She reports current alcohol use. She reports that she does not use drugs.  Allergies:  Allergies  Allergen Reactions   Codeine Anaphylaxis    Ears swelled up,  patient develops severe blisters which cause sloughing of the skin   Cefdinir Diarrhea   Cefuroxime Axetil Diarrhea   Monosodium Glutamate     MSG causes migraines    Red Dye Hives   Strawberry Extract Hives    Medications Prior to Admission  Medication Sig Dispense Refill Last Dose   albuterol (PROVENTIL) (2.5 MG/3ML) 0.083% nebulizer solution 3 ml as needed (Patient taking differently: Take 2.5 mg by nebulization every 6 (six) hours as needed for wheezing or shortness of breath.) 75 mL 5 Past Month   albuterol (VENTOLIN HFA) 108 (90 Base) MCG/ACT inhaler Inhale 1-2 puffs into the lungs every 6 (six) hours as needed for wheezing or shortness of breath.   Past Month   aspirin 81 MG tablet Take 81 mg by mouth every evening.    04/07/2022   azelastine (ASTELIN) 0.1 % nasal spray Place 1 spray into both nostrils 2 (two) times daily.   04/12/2022   benzonatate (TESSALON) 200 MG capsule Take 200 mg by mouth as needed for cough.   Past Week   dextromethorphan (DELSYM) 30 MG/5ML liquid Take 60 mg by mouth as needed for cough.   Past Week   Elagolix Sodium (ORILISSA) 150 MG TABS Take 75 mg by mouth daily.   04/12/2022   ELDERBERRY-VITAMIN C-ZINC PO Take  2 tablets by mouth daily.      fexofenadine-pseudoephedrine (ALLEGRA-D) 60-120 MG per tablet Take 0.5 tablets by mouth daily.   Past Week   ibuprofen (ADVIL) 200 MG tablet Take 600 mg by mouth every 6 (six) hours as needed for moderate pain.   Past Week   KLOR-CON M10 10 MEQ tablet TAKE 1 TABLET BY MOUTH 2 TIMES DAILY. 180 tablet 3 Past Week   MAGNESIUM PO Take 2 tablets by mouth every evening.   Past Week   Multiple Vitamin (CALCIUM COMPLEX PO) Take 1 tablet by mouth 2 (two) times daily.   Past Week   norethindrone (AYGESTIN) 5 MG tablet Take 5 mg by mouth daily.   04/12/2022   Omega-3 Fatty Acids (OMEGA 3 PO) Take 2 capsules by mouth every evening.   Past Week   OVER THE COUNTER MEDICATION Place 1 Application into both eyes daily as needed (irritation).  Bausch & Lomb advanced eye relief eye wash   Past Week   Soft Lens Products (B & L SENSITIVE EYES SALINE) 0.4 % SOLN by Does not apply route.   Past Week    Review of Systems Denies F/C/N/V/D  Blood pressure (!) 133/96, pulse (!) 103, temperature 98.4 F (36.9 C), temperature source Oral, resp. rate (!) 22, height 5' (1.524 m), weight 49 kg, last menstrual period 12/04/2020, SpO2 99 %. Physical Exam Lungs unlabored breathing CV RRR Abdomen soft, NT Extremities no calf tenderness  Results for orders placed or performed during the hospital encounter of 04/12/22 (from the past 24 hour(s))  ABO/Rh     Status: None   Collection Time: 04/12/22 10:45 AM  Result Value Ref Range   ABO/RH(D)      O POS Performed at [facility], [address], [place], [place] [address]   Pregnancy, urine POC     Status: None   Collection Time: 04/12/22 10:47 AM  Result Value Ref Range   Preg Test, Ur NEGATIVE NEGATIVE    No results found.  Assessment/Plan: G0 presenting for planned TLH/poss TAH/RSO/Cystoscopy.  Risks benefits and alternatives reviewed with the patient at length including but not limited to bleeding infection injury including bowel or bladder injury and possible subsequent sequelae. Questions answered and consent signed and witnessed.  Purcell Nails 04/12/2022, 12:02 PM

## 2022-04-12 NOTE — Anesthesia Preprocedure Evaluation (Addendum)
Anesthesia Evaluation  Patient identified by MRN, date of birth, ID band Patient awake    Reviewed: Allergy & Precautions, NPO status , Patient's Chart, lab work & pertinent test results  History of Anesthesia Complications (+) PONV and history of anesthetic complications  Airway Mallampati: II  TM Distance: >3 FB Neck ROM: Full    Dental no notable dental hx. (+) Dental Advisory Given, Teeth Intact   Pulmonary asthma    Pulmonary exam normal breath sounds clear to auscultation- rhonchi (-) wheezing      Cardiovascular negative cardio ROS Normal cardiovascular exam Rhythm:Regular Rate:Normal     Neuro/Psych  Headaches CVA    GI/Hepatic negative GI ROS, Neg liver ROS,,,  Endo/Other  negative endocrine ROS    Renal/GU negative Renal ROS     Musculoskeletal negative musculoskeletal ROS (+)    Abdominal   Peds  Hematology negative hematology ROS (+)   Anesthesia Other Findings   Reproductive/Obstetrics                             Anesthesia Physical Anesthesia Plan  ASA: 2  Anesthesia Plan: General   Post-op Pain Management: Tylenol PO (pre-op)* and Gabapentin PO (pre-op)*   Induction: Intravenous  PONV Risk Score and Plan: 4 or greater and Ondansetron, Dexamethasone, Treatment may vary due to age or medical condition, Midazolam and Propofol infusion  Airway Management Planned: Oral ETT  Additional Equipment:   Intra-op Plan:   Post-operative Plan: Extubation in OR  Informed Consent: I have reviewed the patients History and Physical, chart, labs and discussed the procedure including the risks, benefits and alternatives for the proposed anesthesia with the patient or authorized representative who has indicated his/her understanding and acceptance.     Dental advisory given  Plan Discussed with: CRNA  Anesthesia Plan Comments: (Some residual unproductive cough. No fever or  URI. States she this is typical for allergies. We discussed increased risk of pulmonary complications, but given this is not infectious, the likelihood I believe is low.)       Anesthesia Quick Evaluation

## 2022-04-12 NOTE — Op Note (Signed)
I was asked to evaluate the patient intra-op.  There was some uncertainty on the anatomy involving the rectum.  I placed a rigid proctoscope into the rectum and inflated it with air.  This provided clear visualization of the wall of the rectum to aid with Dr. Les Pou dissection.   Quentin Ore, MD General, Bariatric and Minimally Invasive Surgery [facility] [place]

## 2022-04-12 NOTE — Transfer of Care (Signed)
Immediate Anesthesia Transfer of Care Note  Patient: [name]  Procedure(s) Performed: TOTAL LAPAROSCOPIC HYSTERECTOMY WITH RIGHT SALPINGO OOPHORECTOMY (Right) CYSTOSCOPY (Bladder) PROCTOSCOPY (Anus)  Patient Location: PACU  Anesthesia Type:General  Level of Consciousness: drowsy and patient cooperative  Airway & Oxygen Therapy: Patient Spontanous Breathing  Post-op Assessment: Report given to RN and Post -op Vital signs reviewed and stable  Post vital signs: Reviewed and stable  Last Vitals:  Vitals Value Taken Time  BP 120/80   Temp    Pulse 98   Resp 16   SpO2 96     Last Pain:  Vitals:   04/12/22 1041  TempSrc:   PainSc: 0-No pain         Complications: No notable events documented.

## 2022-04-12 NOTE — Anesthesia Procedure Notes (Signed)
Procedure Name: Intubation Date/Time: 04/12/2022 12:36 PM  Performed by: Katina Degree, CRNAPre-anesthesia Checklist: Patient identified, Emergency Drugs available, Suction available and Patient being monitored Patient Re-evaluated:Patient Re-evaluated prior to induction Oxygen Delivery Method: Circle system utilized Preoxygenation: Pre-oxygenation with 100% oxygen Induction Type: IV induction Ventilation: Mask ventilation without difficulty Laryngoscope Size: Mac and 4 Grade View: Grade I Tube type: Oral Tube size: 7.0 mm Number of attempts: 1 Airway Equipment and Method: Stylet and Oral airway Placement Confirmation: ETT inserted through vocal cords under direct vision, positive ETCO2 and breath sounds checked- equal and bilateral Secured at: 21 cm Tube secured with: Tape Dental Injury: Teeth and Oropharynx as per pre-operative assessment

## 2022-04-12 NOTE — Op Note (Addendum)
Preop Diagnosis: 1.ENDOMETRIOSIS 2.PELVIC PAIN 3.FIBROID UTERUS   Postop Diagnosis: 1.ENDOMETRIOSIS 2.PELVIC PAIN 3.FIBROID UTERUS  Procedure: TOTAL LAPAROSCOPIC HYSTERECTOMY WITH RIGHT SALPINGO OOPHORECTOMY CYSTOSCOPY LOA AND PROCTOSCOPY BY GENERAL SURGERY  Anesthesia: General   Anesthesiologist: Lewie Loron, MD   Attending: Osborn Coho, MD   Assistant: Reesa Chew, MD  General Surgeon Consultant: Quentin Ore, MD   Findings: Fibroid uterus about 8wk size, nl appearing ovary and tube.  Adhesions.  Pathology: Uterus, Cervix and Right Fallopian Tube  Fluids: 1500 cc  UOP: 200 cc  EBL: 50 cc  Complications: None  Procedure: The patient was taken to the operating room, placed under general anesthesia and prepped and draped in the normal sterile fashion. A Foley catheter was placed in the bladder. The uterus sounded to 7cm.  A weighted speculum and vaginal retractors were placed in the vagina.  Tenaculum was placed on the anterior lip of the cervix.  A size 2.5 cm tip was used and the avincula was placed, tip balloon and occluder insufflated at appropriate times. Attention was then turned to the abdomen. A 10 mm infraumbilical incision was made with the scalpel after 8 cc of 0.25% percent Marcaine was used for local anesthesia. The Veress needle was placed in the intra-abdominal cavity and insufflation obtained.  A 10 mm trochar was advanced into the intra-abdominal cavity and the laparoscope was introduced.  A 5 mm trochar was placed in the right lower quadrant under direct visualization with the laparoscope and a 10 mm trochar was placed in the suprapubic area under direct visualization as well.  The right fallopian tube was excised with the ligasure and the same was done on the contralateral side.  The ligasure was then used to cauterize and cut the uterine ovarian ligament on the right as well as the round ligament and bladder flap created.  The same was done to  the round on the contralateral side.  General surgery was consulted to look at the rectum as it appeared very close to the colpotomy site.  He performed proctoscopy, please see his note.  The uterine artery on the right was cauterized and cut with the ligasure and the same done on the contralateral side.  The ligasure hook was then used to circumscribe the [place] ring and free the uterus and cervix on the right. The same was done on the contralateral side.  The uterus was then pulled into the vagina and removed through the vagina and minor lacerations noted d/t small size of vagina and introitus.  Both angles were sutured with 1 PDS.  The  remainder of the cuff was sutured with 1 PDS using interrupted stitches until the vaginal cuff was closed.  A total of approximately 5 sutures were used.   Irrigation was performed.  Gas was allowed to leave the abdomen to check for bleeders and all pedicles were seen to be hemostatic the patient was given methylene.  The vagina was inspected and the cuff was noted to be intact.  There was some bleeding noted at minor lacerations in the vagina where the uterus was removed.    The abdomen and pelvis was copiously irrigated and  hemostasis was noted.  All trochars were removed under direct visualization using the laparoscope.  Cystoscopy was performed by me while Dr. Alyse Low repaired the abdominal incisions.  Both ureters were seen to efflux without difficulty. The bladder had full integrity with no suture or laceration visualized.  The two 26mm fascial incisions were reapproximated with 0 vicryl.  The two 10 mm incisions were closed with 3-0 Monocryl via a subcuticular stitch.  The 34mm LLQ incision was repaired with 3-0 monocryl as well.  All remaining skin incisions were closed with Dermabond and the 10 and 8  mm skin incisions were reinforced using Dermabond.  Sponge lap and needle counts were correct.  The patient tolerated the procedure well and was returned to the PACU in stable  condition.  I was present and scrubbed and the assistant was required due to complexity of anatomy.

## 2022-04-13 ENCOUNTER — Encounter (HOSPITAL_COMMUNITY): Payer: Self-pay | Admitting: Obstetrics and Gynecology

## 2022-04-13 DIAGNOSIS — D251 Intramural leiomyoma of uterus: Secondary | ICD-10-CM | POA: Diagnosis not present

## 2022-04-13 DIAGNOSIS — J45909 Unspecified asthma, uncomplicated: Secondary | ICD-10-CM | POA: Diagnosis not present

## 2022-04-13 DIAGNOSIS — N809 Endometriosis, unspecified: Secondary | ICD-10-CM | POA: Diagnosis not present

## 2022-04-13 DIAGNOSIS — N736 Female pelvic peritoneal adhesions (postinfective): Secondary | ICD-10-CM | POA: Diagnosis not present

## 2022-04-13 DIAGNOSIS — Z7982 Long term (current) use of aspirin: Secondary | ICD-10-CM | POA: Diagnosis not present

## 2022-04-13 DIAGNOSIS — Z79899 Other long term (current) drug therapy: Secondary | ICD-10-CM | POA: Diagnosis not present

## 2022-04-13 DIAGNOSIS — Z793 Long term (current) use of hormonal contraceptives: Secondary | ICD-10-CM | POA: Diagnosis not present

## 2022-04-13 DIAGNOSIS — N72 Inflammatory disease of cervix uteri: Secondary | ICD-10-CM | POA: Diagnosis not present

## 2022-04-13 DIAGNOSIS — N8000 Endometriosis of the uterus, unspecified: Secondary | ICD-10-CM | POA: Diagnosis not present

## 2022-04-13 LAB — BASIC METABOLIC PANEL
Anion gap: 9 (ref 5–15)
BUN: 11 mg/dL (ref 6–20)
CO2: 21 mmol/L — ABNORMAL LOW (ref 22–32)
Calcium: 7.6 mg/dL — ABNORMAL LOW (ref 8.9–10.3)
Chloride: 102 mmol/L (ref 98–111)
Creatinine, Ser: 0.63 mg/dL (ref 0.44–1.00)
GFR, Estimated: >60 mL/min (ref >60)
Glucose, Bld: 154 mg/dL — ABNORMAL HIGH (ref 70–99)
Potassium: 3.9 mmol/L (ref 3.5–5.1)
Sodium: 132 mmol/L — ABNORMAL LOW (ref 135–145)

## 2022-04-13 LAB — CBC
HCT: 31.2 % — ABNORMAL LOW (ref 36.0–46.0)
Hemoglobin: 11.1 g/dL — ABNORMAL LOW (ref 12.0–15.0)
MCH: 31.8 pg (ref 26.0–34.0)
MCHC: 35.6 g/dL (ref 30.0–36.0)
MCV: 89.4 fL (ref 80.0–100.0)
Platelets: 178 10*3/uL (ref 150–400)
RBC: 3.49 MIL/uL — ABNORMAL LOW (ref 3.87–5.11)
RDW: 12.4 % (ref 11.5–15.5)
WBC: 9.5 10*3/uL (ref 4.0–10.5)
nRBC: 0 % (ref 0.0–0.2)

## 2022-04-13 MED ORDER — SODIUM CHLORIDE 0.9 % IV SOLN: Freq: Once | INTRAVENOUS | Status: DC

## 2022-04-13 MED ORDER — OXYCODONE HCL 5 MG PO TABS: 5.0000 mg | ORAL_TABLET | Freq: Four times a day (QID) | ORAL | 0 refills | Status: DC | PRN

## 2022-04-13 MED ORDER — SODIUM CHLORIDE 0.9 % IV BOLUS: 250.0000 mL | Freq: Once | INTRAVENOUS | Status: AC

## 2022-04-13 MED ORDER — SODIUM CHLORIDE 0.9 % IV BOLUS
250.0000 mL | Freq: Once | INTRAVENOUS | Status: AC
  Administered 2022-04-13: 250 mL via INTRAVENOUS

## 2022-04-13 MED ORDER — IBUPROFEN 200 MG PO TABS: 600.0000 mg | ORAL_TABLET | Freq: Four times a day (QID) | ORAL | 1 refills | Status: AC | PRN

## 2022-04-13 NOTE — Discharge Summary (Signed)
Physician Discharge Summary  Patient ID: [name] MRN: [id number] DOB/AGE: 1975-03-02 48 y.o.  Admit date: 04/12/2022 Discharge date: 04/13/2022  Admission Diagnoses: Endometriosis Pelvic Pain  Discharge Diagnoses:  Principal Problem:   Endometriosis Pelvic Pain Fibroid  Discharged Condition: good  Hospital Course: Doing well post op day #1.  Tolerating regular diet, ambulating without difficulty and pain controlled.  Minimal spotting from vaginal lacerations.    Consults: None  Significant Diagnostic Studies: Hgb 11  Treatments: surgery: TLH/RSO/Cystoscopy/Proctoscopy  Discharge Exam: Blood pressure 124/72, pulse 81, temperature 98.5 F (36.9 C), temperature source Oral, resp. rate 18, height 5' (1.524 m), weight 49 kg, last menstrual period 12/04/2020, SpO2 97 %.  General appearance: alert and no distress Resp: clear to auscultation bilaterally Cardio: regular rate and rhythm GI:  soft, appropriately tender, dermabond on all incisions, NABS, no rebound or guarding Extremities:  no calf tenderness  Disposition: Discharge disposition: 01-Home or Self Care        Allergies as of 04/13/2022       Reactions   Codeine Anaphylaxis   Ears swelled up, patient develops severe blisters which cause sloughing of the skin   Cefdinir Diarrhea   Cefuroxime Axetil Diarrhea   Monosodium Glutamate    MSG causes migraines    Red Dye Hives   Strawberry Extract Hives        Medication List     STOP taking these medications    norethindrone 5 MG tablet Commonly known as: AYGESTIN   Orilissa 150 MG Tabs Generic drug: Elagolix Sodium       TAKE these medications    albuterol 108 (90 Base) MCG/ACT inhaler Commonly known as: VENTOLIN HFA Inhale 1-2 puffs into the lungs every 6 (six) hours as needed for wheezing or shortness of breath. What changed: Another medication with the same name was changed. Make sure you understand how and when to take each.   albuterol  (2.5 MG/3ML) 0.083% nebulizer solution Commonly known as: PROVENTIL 3 ml as needed What changed:  how much to take how to take this when to take this reasons to take this additional instructions   aspirin 81 MG tablet Take 81 mg by mouth every evening.   azelastine 0.1 % nasal spray Commonly known as: ASTELIN Place 1 spray into both nostrils 2 (two) times daily.   B & L Sensitive Eyes Saline 0.4 % Soln by Does not apply route.   benzonatate 200 MG capsule Commonly known as: TESSALON Take 200 mg by mouth as needed for cough.   CALCIUM COMPLEX PO Take 1 tablet by mouth 2 (two) times daily.   Delsym 30 MG/5ML liquid Generic drug: dextromethorphan Take 60 mg by mouth as needed for cough.   ELDERBERRY-VITAMIN C-ZINC PO Take 2 tablets by mouth daily.   fexofenadine-pseudoephedrine 60-120 MG 12 hr tablet Commonly known as: ALLEGRA-D Take 0.5 tablets by mouth daily.   ibuprofen 200 MG tablet Commonly known as: ADVIL Take 3 tablets (600 mg total) by mouth every 6 (six) hours as needed for moderate pain.   Klor-Con M10 10 MEQ tablet Generic drug: potassium chloride TAKE 1 TABLET BY MOUTH 2 TIMES DAILY.   MAGNESIUM PO Take 2 tablets by mouth every evening.   OMEGA 3 PO Take 2 capsules by mouth every evening.   OVER THE COUNTER MEDICATION Place 1 Application into both eyes daily as needed (irritation). Bausch & Lomb advanced eye relief eye wash   oxyCODONE 5 MG immediate release tablet Commonly known as: Oxy  IR/ROXICODONE Take 1 tablet (5 mg total) by mouth every 6 (six) hours as needed for moderate pain, severe pain or breakthrough pain.         Signed: Purcell Nails 04/13/2022, 1:46 PM

## 2022-04-13 NOTE — Anesthesia Postprocedure Evaluation (Signed)
Anesthesia Post Note  Patient: [name]  Procedure(s) Performed: TOTAL LAPAROSCOPIC HYSTERECTOMY WITH RIGHT SALPINGO OOPHORECTOMY (Right) CYSTOSCOPY (Bladder) PROCTOSCOPY (Anus)     Patient location during evaluation: PACU Anesthesia Type: General Level of consciousness: awake and alert Pain management: pain level controlled Vital Signs Assessment: post-procedure vital signs reviewed and stable Respiratory status: spontaneous breathing Cardiovascular status: stable Anesthetic complications: no   No notable events documented.  Last Vitals:  Vitals:   04/13/22 0924 04/13/22 1142  BP:  124/72  Pulse:  81  Resp: (!) 23 18  Temp:  36.9 C  SpO2: 99% 97%    Last Pain:  Vitals:   04/13/22 1142  TempSrc: Oral  PainSc:                  Lewie Loron

## 2022-04-13 NOTE — Progress Notes (Signed)
PCA discontinued at 0924. Patient alert and oriented. Patient ambulates in room and hallway without incident. Scant amount of sanguinous drainage on umbilicus; Dr. Su Hilt aware.

## 2022-04-13 NOTE — Plan of Care (Signed)
  Problem: Education: Goal: Knowledge of General Education information will improve Description: Including pain rating scale, medication(s)/side effects and non-pharmacologic comfort measures Outcome: Completed/Met   Problem: Health Behavior/Discharge Planning: Goal: Ability to manage health-related needs will improve Outcome: Completed/Met   Problem: Clinical Measurements: Goal: Ability to maintain clinical measurements within normal limits will improve Outcome: Completed/Met Goal: Will remain free from infection Outcome: Completed/Met Goal: Diagnostic test results will improve Outcome: Completed/Met Goal: Respiratory complications will improve Outcome: Completed/Met Goal: Cardiovascular complication will be avoided Outcome: Completed/Met   Problem: Activity: Goal: Risk for activity intolerance will decrease Outcome: Completed/Met   Problem: Nutrition: Goal: Adequate nutrition will be maintained Outcome: Completed/Met   Problem: Coping: Goal: Level of anxiety will decrease Outcome: Completed/Met   Problem: Elimination: Goal: Will not experience complications related to bowel motility Outcome: Completed/Met Goal: Will not experience complications related to urinary retention Outcome: Completed/Met   Problem: Pain Managment: Goal: General experience of comfort will improve Outcome: Completed/Met   Problem: Safety: Goal: Ability to remain free from injury will improve Outcome: Completed/Met   Problem: Skin Integrity: Goal: Risk for impaired skin integrity will decrease Outcome: Completed/Met   Problem: Education: Goal: Knowledge of the prescribed therapeutic regimen will improve Outcome: Completed/Met Goal: Understanding of sexual limitations or changes related to disease process or condition will improve Outcome: Completed/Met Goal: Individualized Educational Video(s) Outcome: Completed/Met   Problem: Self-Concept: Goal: Communication of feelings regarding  changes in body function or appearance will improve Outcome: Completed/Met   Problem: Skin Integrity: Goal: Demonstration of wound healing without infection will improve Outcome: Completed/Met   

## 2022-04-16 LAB — SURGICAL PATHOLOGY

## 2022-04-19 ENCOUNTER — Encounter: Payer: Self-pay | Admitting: *Deleted

## 2022-04-22 DIAGNOSIS — J3081 Allergic rhinitis due to animal (cat) (dog) hair and dander: Secondary | ICD-10-CM | POA: Diagnosis not present

## 2022-04-22 DIAGNOSIS — J3089 Other allergic rhinitis: Secondary | ICD-10-CM | POA: Diagnosis not present

## 2022-04-22 DIAGNOSIS — J4520 Mild intermittent asthma, uncomplicated: Secondary | ICD-10-CM | POA: Diagnosis not present

## 2022-04-22 DIAGNOSIS — J301 Allergic rhinitis due to pollen: Secondary | ICD-10-CM | POA: Diagnosis not present

## 2022-05-13 ENCOUNTER — Telehealth: Payer: Self-pay

## 2022-05-13 NOTE — Telephone Encounter (Signed)
Pt called requesting Dr. Adele Dan advice on whether she should go on a prescription estrogen medication d/t her recent total hysterectomy. She was concerned about the possible risk of stroke/DVT.  Spoke with Dr. Edilia Bo who advised that she speak with her GYN, but that the risk is extremely low and if she needs the estrogen, then she should follow her GYN's advice on that.   Called pt to relay Dr. Adele Dan advice. Confirmed understanding.

## 2022-06-25 DIAGNOSIS — M810 Age-related osteoporosis without current pathological fracture: Secondary | ICD-10-CM | POA: Diagnosis not present

## 2022-07-01 ENCOUNTER — Encounter: Payer: Self-pay | Admitting: Family Medicine

## 2022-07-02 ENCOUNTER — Other Ambulatory Visit: Payer: Self-pay | Admitting: Obstetrics and Gynecology

## 2022-07-02 DIAGNOSIS — Q782 Osteopetrosis: Secondary | ICD-10-CM

## 2022-07-02 NOTE — Telephone Encounter (Signed)
I agree we should avoid any estrogen products for her. If she experiences hot flashes, she can use OTC black cohosh (this is not hormonal). If we want to treat the osteoporosis, she could take something like Alendronate pills or Prolia shots or Reclast infusions. I think she should have a visit with Dr. Edilia Bo to discuss these options

## 2022-08-04 ENCOUNTER — Other Ambulatory Visit: Payer: Self-pay | Admitting: Family Medicine

## 2022-08-06 NOTE — Telephone Encounter (Signed)
Pt checking on progress of this refill  

## 2022-08-20 ENCOUNTER — Encounter: Payer: Self-pay | Admitting: Family Medicine

## 2022-08-20 ENCOUNTER — Ambulatory Visit (INDEPENDENT_AMBULATORY_CARE_PROVIDER_SITE_OTHER): Payer: BC Managed Care – PPO | Admitting: Family Medicine

## 2022-08-20 VITALS — BP 110/80 | HR 81 | Temp 98.2°F | Ht 60.75 in | Wt 103.4 lb

## 2022-08-20 DIAGNOSIS — Z0000 Encounter for general adult medical examination without abnormal findings: Secondary | ICD-10-CM

## 2022-08-20 LAB — CBC WITH DIFFERENTIAL/PLATELET
Basophils Absolute: 0 10*3/uL (ref 0.0–0.1)
Basophils Relative: 1.1 % (ref 0.0–3.0)
Eosinophils Absolute: 0.1 10*3/uL (ref 0.0–0.7)
Eosinophils Relative: 3.5 % (ref 0.0–5.0)
HCT: 42.1 % (ref 36.0–46.0)
Hemoglobin: 14.1 g/dL (ref 12.0–15.0)
Lymphocytes Relative: 33 % (ref 12.0–46.0)
Lymphs Abs: 1.3 10*3/uL (ref 0.7–4.0)
MCHC: 33.4 g/dL (ref 30.0–36.0)
MCV: 90.4 fl (ref 78.0–100.0)
Monocytes Absolute: 0.4 10*3/uL (ref 0.1–1.0)
Monocytes Relative: 11.1 % (ref 3.0–12.0)
Neutro Abs: 2 10*3/uL (ref 1.4–7.7)
Neutrophils Relative %: 51.3 % (ref 43.0–77.0)
Platelets: 236 10*3/uL (ref 150.0–400.0)
RBC: 4.65 Mil/uL (ref 3.87–5.11)
RDW: 13.5 % (ref 11.5–15.5)
WBC: 3.9 10*3/uL — ABNORMAL LOW (ref 4.0–10.5)

## 2022-08-20 LAB — HEMOGLOBIN A1C: Hgb A1c MFr Bld: 5.6 % (ref 4.6–6.5)

## 2022-08-20 LAB — LIPID PANEL
Cholesterol: 215 mg/dL — ABNORMAL HIGH (ref 0–200)
HDL: 96.5 mg/dL (ref >39.00)
LDL Cholesterol: 108 mg/dL — ABNORMAL HIGH (ref 0–99)
NonHDL: 118.6
Total CHOL/HDL Ratio: 2
Triglycerides: 52 mg/dL (ref 0.0–149.0)
VLDL: 10.4 mg/dL (ref 0.0–40.0)

## 2022-08-20 LAB — TSH: TSH: 1.11 u[IU]/mL (ref 0.35–5.50)

## 2022-08-20 LAB — HEPATIC FUNCTION PANEL
ALT: 11 U/L (ref 0–35)
AST: 18 U/L (ref 0–37)
Albumin: 4.8 g/dL (ref 3.5–5.2)
Alkaline Phosphatase: 63 U/L (ref 39–117)
Bilirubin, Direct: 0.3 mg/dL (ref 0.0–0.3)
Total Bilirubin: 1.7 mg/dL — ABNORMAL HIGH (ref 0.2–1.2)
Total Protein: 7.3 g/dL (ref 6.0–8.3)

## 2022-08-20 LAB — BASIC METABOLIC PANEL
BUN: 16 mg/dL (ref 6–23)
CO2: 30 mEq/L (ref 19–32)
Calcium: 10.4 mg/dL (ref 8.4–10.5)
Chloride: 96 mEq/L (ref 96–112)
Creatinine, Ser: 0.61 mg/dL (ref 0.40–1.20)
GFR: 105.86 mL/min (ref >60.00)
Glucose, Bld: 87 mg/dL (ref 70–99)
Potassium: 4 mEq/L (ref 3.5–5.1)
Sodium: 135 mEq/L (ref 135–145)

## 2022-08-20 MED ORDER — PROMETHAZINE HCL 25 MG PO TABS: 25.0000 mg | ORAL_TABLET | ORAL | 2 refills | Status: AC | PRN

## 2022-08-20 NOTE — Progress Notes (Signed)
   Subjective:    Patient ID: [name], female    DOB: 08-16-1975, 47 y.o.   MRN: [id number]  HPI Here for a well exam. She feels well. She does have some trouble sleeping however. She falls asleep quickly but she tends to wake up early.    Review of Systems  Constitutional: Negative.   HENT: Negative.    Eyes: Negative.   Respiratory: Negative.    Cardiovascular: Negative.   Gastrointestinal: Negative.   Genitourinary:  Negative for decreased urine volume, difficulty urinating, dyspareunia, dysuria, enuresis, flank pain, frequency, hematuria, pelvic pain and urgency.  Musculoskeletal: Negative.   Skin: Negative.   Neurological: Negative.  Negative for headaches.  Psychiatric/Behavioral:  Positive for sleep disturbance.        Objective:   Physical Exam Constitutional:      General: She is not in acute distress.    Appearance: Normal appearance. She is well-developed.  HENT:     Head: Normocephalic and atraumatic.     Right Ear: External ear normal.     Left Ear: External ear normal.     Nose: Nose normal.     Mouth/Throat:     Pharynx: No oropharyngeal exudate.  Eyes:     General: No scleral icterus.    Conjunctiva/sclera: Conjunctivae normal.     Pupils: Pupils are equal, round, and reactive to light.  Neck:     Thyroid: No thyromegaly.     Vascular: No JVD.  Cardiovascular:     Rate and Rhythm: Normal rate and regular rhythm.     Heart sounds: Normal heart sounds. No murmur heard.    No friction rub. No gallop.  Pulmonary:     Effort: Pulmonary effort is normal. No respiratory distress.     Breath sounds: Normal breath sounds. No wheezing or rales.  Chest:     Chest wall: No tenderness.  Abdominal:     General: Bowel sounds are normal. There is no distension.     Palpations: Abdomen is soft. There is no mass.     Tenderness: There is no abdominal tenderness. There is no guarding or rebound.  Musculoskeletal:        General: No tenderness. Normal range of  motion.     Cervical back: Normal range of motion and neck supple.  Lymphadenopathy:     Cervical: No cervical adenopathy.  Skin:    General: Skin is warm and dry.     Findings: No erythema or rash.  Neurological:     General: No focal deficit present.     Mental Status: She is alert and oriented to person, place, and time.     Cranial Nerves: No cranial nerve deficit.     Motor: No abnormal muscle tone.     Coordination: Coordination normal.     Deep Tendon Reflexes: Reflexes are normal and symmetric. Reflexes normal.  Psychiatric:        Mood and Affect: Mood normal.        Behavior: Behavior normal.        Thought Content: Thought content normal.        Judgment: Judgment normal.           Assessment & Plan:  Well exam. We discussed diet and exercise. Get fasting labs. For sleep, she will try OTC melatonin and then report back to [place]. Gershon Crane, MD

## 2022-11-22 ENCOUNTER — Other Ambulatory Visit: Payer: BC Managed Care – PPO

## 2022-12-06 ENCOUNTER — Ambulatory Visit: Payer: BC Managed Care – PPO | Admitting: Family Medicine

## 2022-12-06 ENCOUNTER — Encounter: Payer: Self-pay | Admitting: Family Medicine

## 2022-12-06 VITALS — BP 120/84 | HR 96 | Temp 98.6°F | Ht 60.75 in | Wt 104.2 lb

## 2022-12-06 DIAGNOSIS — R0981 Nasal congestion: Secondary | ICD-10-CM

## 2022-12-06 DIAGNOSIS — R059 Cough, unspecified: Secondary | ICD-10-CM | POA: Diagnosis not present

## 2022-12-06 LAB — POC COVID19 BINAXNOW: SARS Coronavirus 2 Ag: NEGATIVE

## 2022-12-06 MED ORDER — AZITHROMYCIN 250 MG PO TABS: ORAL_TABLET | ORAL | 0 refills | Status: AC

## 2022-12-06 NOTE — Progress Notes (Signed)
Established Patient Office Visit  Subjective   Patient ID: NIL ANSTETT, female    DOB: Aug 04, 1975  Age: 47 y.o. MRN: [id number]  Chief Complaint  Patient presents with   Sinusitis    Patient complains of sinuitis, x4 days, Tried Allegra    HPI   [name] is seen as a work in with 4-day history of sinusitis symptoms.  She has tried Careers adviser without much improvement.  She has had some cough and sinus drainage consistent of greenish to brownish mucus.  She is concerned because she is leaving for [place] later this week.  She does have history of asthma and just recently placed on Qvar per allergist but has not yet started.  She does have rescue inhaler.  No active wheezing.  Non-smoker.  Past Medical History:  Diagnosis Date   Allergy    Asthma    sees Dr. Irena Cords    Chronic headaches    Migraine   Endometriosis    Family history of adverse reaction to anesthesia    Sister has post operative nausea and vomiting.   Fibromuscular dysplasia of cervicocranial artery (HCC)    Hypoglycemia    Internal carotid artery dissection (HCC)    saw Dr. Cari Caraway    Osteoporosis    Paresthesia of left upper limb    saw Dr. Kathrine Haddock    PONV (postoperative nausea and vomiting)    Stroke [facility]) 01/17/2013   age 62 dissection of left carotid artery   Past Surgical History:  Procedure Laterality Date   APPENDECTOMY     COLONOSCOPY  05/02/2020   per Dr. Loreta Ave, benign polyp, repeat in 10 yrs   CYSTOSCOPY  04/12/2022   Procedure: CYSTOSCOPY;  Surgeon: Osborn Coho, MD;  Location: [facility] OR;  Service: Gynecology;;   DILATION AND CURETTAGE OF UTERUS     hysteroscopy   LAPAROSCOPIC OOPHERECTOMY Left 06/10/2016   per Dr. Osborn Coho for endometriosis    LAPAROSCOPIC OVARIAN CYSTECTOMY Bilateral 07/04/2014   Procedure: LAPAROSCOPIC OVARIAN CYSTECTOMY;  Surgeon: Osborn Coho, MD;  Location: [facility] ORS;  Service: Gynecology;  Laterality: Bilateral;   PROCTOSCOPY N/A 04/12/2022   Procedure:  PROCTOSCOPY;  Surgeon: Osborn Coho, MD;  Location: [facility] OR;  Service: Gynecology;  Laterality: N/A;   TOTAL LAPAROSCOPIC HYSTERECTOMY WITH BILATERAL SALPINGO OOPHORECTOMY Right 04/12/2022   Procedure: TOTAL LAPAROSCOPIC HYSTERECTOMY WITH RIGHT SALPINGO OOPHORECTOMY;  Surgeon: Osborn Coho, MD;  Location: [facility] OR;  Service: Gynecology;  Laterality: Right;   WISDOM TOOTH EXTRACTION      reports that she has never smoked. She has never used smokeless tobacco. She reports current alcohol use. She reports that she does not use drugs. family history includes Bleeding Disorder in her mother; Diabetes in her father; Heart disease in her father; Hypertension in her father and mother; Varicose Veins in her father and mother. Allergies  Allergen Reactions   Codeine Anaphylaxis    Ears swelled up, patient develops severe blisters which cause sloughing of the skin   Cefdinir Diarrhea   Cefuroxime Axetil Diarrhea   Monosodium Glutamate     MSG causes migraines    Red Dye #40 (Allura Red) Hives   Strawberry Extract Hives    Review of Systems  Constitutional:  Negative for chills and fever.  HENT:  Positive for congestion.   Respiratory:  Positive for cough and sputum production. Negative for hemoptysis, shortness of breath and wheezing.       Objective:     BP 120/84 (BP Location:  Left Arm, Patient Position: Sitting, Cuff Size: Normal)   Pulse 96   Temp 98.6 F (37 C) (Oral)   Ht 5' 0.75" (1.543 m)   Wt 104 lb 3.2 oz (47.3 kg)   LMP 12/04/2020 Comment: DOS UPREG NEGATIVE  SpO2 98%   BMI 19.85 kg/m  BP Readings from Last 3 Encounters:  12/06/22 120/84  08/20/22 110/80  04/13/22 124/72   Wt Readings from Last 3 Encounters:  12/06/22 104 lb 3.2 oz (47.3 kg)  08/20/22 103 lb 6.4 oz (46.9 kg)  04/12/22 108 lb (49 kg)      Physical Exam Vitals reviewed.  Constitutional:      General: She is not in acute distress.    Appearance: She is not ill-appearing.  HENT:     Right Ear:  Tympanic membrane normal.     Left Ear: Tympanic membrane normal.     Mouth/Throat:     Mouth: Mucous membranes are moist.     Pharynx: Oropharynx is clear.  Cardiovascular:     Rate and Rhythm: Normal rate and regular rhythm.  Pulmonary:     Effort: Pulmonary effort is normal. No respiratory distress.     Breath sounds: Normal breath sounds. No wheezing or rales.  Musculoskeletal:     Cervical back: Neck supple.  Lymphadenopathy:     Cervical: No cervical adenopathy.      Results for orders placed or performed in visit on 12/06/22  POC COVID-19  Result Value Ref Range   SARS Coronavirus 2 Ag Negative Negative      The ASCVD Risk score (Arnett DK, et al., 2019) failed to calculate for the following reasons:   The patient has a prior MI or stroke diagnosis    Assessment & Plan:   Problem List Items Addressed This Visit   None Visit Diagnoses     Nasal congestion    -  Primary   Relevant Orders   POC COVID-19 (Completed)   Cough, unspecified type       Relevant Orders   POC COVID-19 (Completed)     Patient presents with 1 week history of progressive productive cough and sinusitis symptoms.  COVID testing negative.  We discussed that these are frequently viral.  She has concern with her upcoming trip to [place].  We elected to cover with Zithromax for 5 days and continue Mucinex twice daily.  Continue rescue inhaler as needed though no active wheezing noted at this time.  No follow-ups on file.    Tasha Peat, MD

## 2023-01-10 ENCOUNTER — Other Ambulatory Visit: Payer: Self-pay

## 2023-01-10 DIAGNOSIS — I7771 Dissection of carotid artery: Secondary | ICD-10-CM

## 2023-01-14 DIAGNOSIS — Z01419 Encounter for gynecological examination (general) (routine) without abnormal findings: Secondary | ICD-10-CM | POA: Diagnosis not present

## 2023-01-14 DIAGNOSIS — B977 Papillomavirus as the cause of diseases classified elsewhere: Secondary | ICD-10-CM | POA: Diagnosis not present

## 2023-01-14 DIAGNOSIS — R87811 Vaginal high risk human papillomavirus (HPV) DNA test positive: Secondary | ICD-10-CM | POA: Diagnosis not present

## 2023-01-14 DIAGNOSIS — Z1231 Encounter for screening mammogram for malignant neoplasm of breast: Secondary | ICD-10-CM | POA: Diagnosis not present

## 2023-01-14 DIAGNOSIS — E041 Nontoxic single thyroid nodule: Secondary | ICD-10-CM | POA: Diagnosis not present

## 2023-01-14 DIAGNOSIS — N951 Menopausal and female climacteric states: Secondary | ICD-10-CM | POA: Diagnosis not present

## 2023-01-18 NOTE — Progress Notes (Signed)
Office Note      HPI: [name] is a 48 y.o. 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  This patient has a history of a left internal carotid artery dissection with TIA in 2015 which was managed conservatively.  Imaging at the time demonstrated concern for fibromuscular dysplasia bilaterally.  Since 2015, she has had 40 to 59% stenosis appreciated in the bilateral internal carotid arteries.  She has had no further episodes and has been following up with our office every 2 years.  On exam today, [name] was doing well.  She has had no issues over the last 2 years.  She continues to take aspirin on a daily basis.  She has noted intermittent twitching of the left eyelid, as well as some numbness and tingling in the right hand and fingers when she wakes up in the morning.  This quickly dissipates.  Denies symptoms of TIA, stroke, amaurosis. Denies renal dysfunction Denies claudication, ischemic rest pain, tissue loss   Past Medical History:  Diagnosis Date   Allergy    Asthma    sees Dr. Irena Cords    Chronic headaches    Migraine   Endometriosis    Family history of adverse reaction to anesthesia    Sister has post operative nausea and vomiting.   Fibromuscular dysplasia of cervicocranial artery (HCC)    Hypoglycemia    Internal carotid artery dissection (HCC)    saw Dr. Cari Caraway    Osteoporosis    Paresthesia of left upper limb    saw Dr. Kathrine Haddock    PONV (postoperative nausea and vomiting)    Stroke [facility]) 01/17/2013   age 79 dissection of left carotid artery    Past Surgical History:  Procedure Laterality Date   APPENDECTOMY     COLONOSCOPY  05/02/2020   per Dr. Loreta Ave, benign polyp, repeat in 10 yrs   CYSTOSCOPY  04/12/2022   Procedure: CYSTOSCOPY;  Surgeon: Osborn Coho, MD;  Location: [facility] OR;  Service: Gynecology;;   DILATION AND CURETTAGE OF UTERUS     hysteroscopy    LAPAROSCOPIC OOPHERECTOMY Left 06/10/2016   per Dr. Osborn Coho for endometriosis    LAPAROSCOPIC OVARIAN CYSTECTOMY Bilateral 07/04/2014   Procedure: LAPAROSCOPIC OVARIAN CYSTECTOMY;  Surgeon: Osborn Coho, MD;  Location: [facility];  Service: Gynecology;  Laterality: Bilateral;   PROCTOSCOPY N/A 04/12/2022   Procedure: PROCTOSCOPY;  Surgeon: Osborn Coho, MD;  Location: [facility] OR;  Service: Gynecology;  Laterality: N/A;   TOTAL LAPAROSCOPIC HYSTERECTOMY WITH BILATERAL SALPINGO OOPHORECTOMY Right 04/12/2022   Procedure: TOTAL LAPAROSCOPIC HYSTERECTOMY WITH RIGHT SALPINGO OOPHORECTOMY;  Surgeon: Osborn Coho, MD;  Location: [facility] OR;  Service: Gynecology;  Laterality: Right;   WISDOM TOOTH EXTRACTION      Social History   Socioeconomic History   Marital status: Married    Spouse name: Not on file   Number of children: Not on file   Years of education: Not on file   Highest education level: Some college, no degree  Occupational History   Not on file  Tobacco Use   Smoking status: Never   Smokeless tobacco: Never  Vaping Use   Vaping status: Never Used  Substance and Sexual Activity   Alcohol use: Yes    Alcohol/week: 0.0 standard drinks of alcohol    Comment: rare   Drug use: No   Sexual activity: Not on file  Other Topics Concern   Not on file  Social  History Narrative   Not on file   Social Drivers of Health   Financial Resource Strain: Patient Declined (03/18/2022)   Overall Financial Resource Strain (CARDIA)    Difficulty of Paying Living Expenses: Patient declined  Food Insecurity: No Food Insecurity (04/13/2022)   Hunger Vital Sign    Worried About Running Out of Food in the Last Year: Never true    Ran Out of Food in the Last Year: Never true  Transportation Needs: No Transportation Needs (04/13/2022)   PRAPARE - Administrator, Civil Service (Medical): No    Lack of Transportation (Non-Medical): No  Physical Activity: Insufficiently Active (03/18/2022)    Exercise Vital Sign    Days of Exercise per Week: 4 days    Minutes of Exercise per Session: 30 min  Stress: Not on file  Social Connections: Unknown (03/18/2022)   Social Connection and Isolation Panel [NHANES]    Frequency of Communication with Friends and Family: More than three times a week    Frequency of Social Gatherings with Friends and Family: More than three times a week    Attends Religious Services: More than 4 times per year    Active Member of [employer] or Organizations: Yes    Attends Banker Meetings: More than 4 times per year    Marital Status: Not on file  Intimate Partner Violence: Not At Risk (04/13/2022)   Humiliation, Afraid, Rape, and Kick questionnaire    Fear of Current or Ex-Partner: No    Emotionally Abused: No    Physically Abused: No    Sexually Abused: No   Family History  Problem Relation Age of Onset   Diabetes Father    Heart disease Father    Hypertension Father    Varicose Veins Father    Bleeding Disorder Mother    Varicose Veins Mother    Hypertension Mother     Current Outpatient Medications  Medication Sig Dispense Refill   albuterol (PROVENTIL) (2.5 MG/3ML) 0.083% nebulizer solution 3 ml as needed (Patient taking differently: Take 2.5 mg by nebulization every 6 (six) hours as needed for wheezing or shortness of breath.) 75 mL 5   albuterol (VENTOLIN HFA) 108 (90 Base) MCG/ACT inhaler Inhale 1-2 puffs into the lungs every 6 (six) hours as needed for wheezing or shortness of breath.     aspirin 81 MG tablet Take 81 mg by mouth every evening.      azelastine (ASTELIN) 0.1 % nasal spray Place 1 spray into both nostrils 2 (two) times daily.     ELDERBERRY-VITAMIN C-ZINC PO Take 2 tablets by mouth daily.     fexofenadine-pseudoephedrine (ALLEGRA-D) 60-120 MG per tablet Take 0.5 tablets by mouth daily.     Fezolinetant (VEOZAH) 45 MG TABS Take 1 tablet by mouth daily.     ibuprofen (ADVIL) 200 MG tablet Take 3 tablets (600 mg total) by  mouth every 6 (six) hours as needed for moderate pain. 40 tablet 1   ketotifen (ALAWAY CHILDRENS ALLERGY) 0.035 % ophthalmic solution 1 drop into affected eye Ophthalmic Twice a day     KLOR-CON M10 10 MEQ tablet TAKE 1 TABLET BY MOUTH TWICE A DAY 180 tablet 3   MAGNESIUM PO Take 2 tablets by mouth every evening.     Multiple Vitamin (CALCIUM COMPLEX PO) Take 1 tablet by mouth 2 (two) times daily.     Omega-3 Fatty Acids (OMEGA 3 PO) Take 2 capsules by mouth every evening.     OVER  THE COUNTER MEDICATION Place 1 Application into both eyes daily as needed (irritation). [employer] advanced eye relief eye wash     promethazine (PHENERGAN) 25 MG tablet Take 1 tablet (25 mg total) by mouth every 4 (four) hours as needed for nausea or vomiting (nausea). 40 tablet 2   Soft Lens Products (B & L SENSITIVE EYES SALINE) 0.4 % SOLN by Does not apply route.     No current facility-administered medications for this visit.    Allergies  Allergen Reactions   Codeine Anaphylaxis    Ears swelled up, patient develops severe blisters which cause sloughing of the skin   Cefdinir Diarrhea   Cefuroxime Axetil Diarrhea   Monosodium Glutamate     MSG causes migraines    Red Dye #40 (Allura Red) Hives   Strawberry Extract Hives     REVIEW OF SYSTEMS:  [X]  denotes positive finding, [ ]  denotes negative finding Cardiac  Comments:  Chest pain or chest pressure:    Shortness of breath upon exertion:    Short of breath when lying flat:    Irregular heart rhythm:        Vascular    Pain in calf, thigh, or hip brought on by ambulation:    Pain in feet at night that wakes you up from your sleep:     Blood clot in your veins:    Leg swelling:         Pulmonary    Oxygen at home:    Productive cough:     Wheezing:         Neurologic    Sudden weakness in arms or legs:     Sudden numbness in arms or legs:     Sudden onset of difficulty speaking or slurred speech:    Temporary loss of vision in one  eye:     Problems with dizziness:         Gastrointestinal    Blood in stool:     Vomited blood:         Genitourinary    Burning when urinating:     Blood in urine:        Psychiatric    Major depression:         Hematologic    Bleeding problems:    Problems with blood clotting too easily:        Skin    Rashes or ulcers:        Constitutional    Fever or chills:      PHYSICAL EXAMINATION:  Vitals:   01/20/23 0903 01/20/23 0908  BP: 124/77 128/89  Pulse: 76   Resp: 20   Temp: 97.9 F (36.6 C)   SpO2: 99%   Weight: 105 lb (47.6 kg)   Height: 5' (1.524 m)     General:  WDWN in NAD; vital signs documented above Gait: Not observed HENT: WNL, normocephalic Pulmonary: normal non-labored breathing , without wheezing Cardiac: regular HR Abdomen: soft, NT, no masses Skin: without rashes Vascular Exam/Pulses:  Right Left  Radial 2+ (normal) 2+ (normal)  Ulnar    Femoral 2+ 2+  Popliteal    DP 2+ (normal) 2+ (normal)  PT     Extremities: without ischemic changes, without Gangrene , without cellulitis; without open wounds;  Musculoskeletal: no muscle wasting or atrophy  Neurologic: A&O X 3;  No focal weakness or paresthesias are detected Psychiatric:  The pt has Normal affect.   Non-Invasive Vascular Imaging:  Summary:  Right Carotid: 40-59% mid ICA stenosis consistent with FMD.   Left Carotid: 40-59% mid ICA stenosis consistent with FMD.   Vertebrals:  Bilateral vertebral arteries demonstrate antegrade flow.  Subclavians: Normal flow hemodynamics were seen in bilateral subclavian               arteries.      ASSESSMENT/PLAN: [name] is a 48 y.o. 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  She has had no symptoms over the last 2 years.  On physical exam, no bruit was appreciated. Labs demonstrate normal renal function when last checked.  She has no symptoms of claudication. Imaging demonstrates  stable carotid stenosis bilaterally.   I had a long conversation with Chazmin regarding the above.  We discussed that FMD usually affects 3 different vascular beds, being the internal carotid arteries, renal arteries, iliac arteries.  I am happy that her carotid velocities are stable, and that she remains asymptomatic.  I do not have any concerns for renal insufficiency, nor iliac artery stenosis.   At this point, we will continue two year follow-up.  There is slight several questions regarding osteoporosis medications, Alendronate, Reclast, Prolia.  I am not aware of any vascular contraindications.  Being that she is so young, she may benefit from rheumatology referral if there are medication concerns. Regarding the eyelid twitching, this can be for variety of reasons, but I do not have a vascular etiology.  The numbness and tingling in the right hand in the mornings could be mild carpal tunnel.  We discussed wearing a brace at night to see if symptoms improve.  No concern for TIA, stroke, amaurosis.   Victorino Sparrow, MD [facility] [phone] Total time of patient care including pre-visit research, consultation, and documentation greater than 30 minutes

## 2023-01-19 ENCOUNTER — Ambulatory Visit
Admission: RE | Admit: 2023-01-19 | Discharge: 2023-01-19 | Disposition: A | Discharge status: Home / Self Care | Payer: BC Managed Care – PPO | Source: Ambulatory Visit | Attending: Obstetrics and Gynecology | Admitting: Obstetrics and Gynecology

## 2023-01-19 DIAGNOSIS — Q782 Osteopetrosis: Secondary | ICD-10-CM

## 2023-01-19 DIAGNOSIS — M8588 Other specified disorders of bone density and structure, other site: Secondary | ICD-10-CM | POA: Diagnosis not present

## 2023-01-19 DIAGNOSIS — N958 Other specified menopausal and perimenopausal disorders: Secondary | ICD-10-CM | POA: Diagnosis not present

## 2023-01-19 DIAGNOSIS — Z90722 Acquired absence of ovaries, bilateral: Secondary | ICD-10-CM | POA: Diagnosis not present

## 2023-01-20 ENCOUNTER — Ambulatory Visit (HOSPITAL_COMMUNITY)
Admission: RE | Admit: 2023-01-20 | Discharge: 2023-01-20 | Disposition: A | Discharge status: Home / Self Care | Payer: BC Managed Care – PPO | Source: Ambulatory Visit | Attending: Vascular Surgery | Admitting: Vascular Surgery

## 2023-01-20 ENCOUNTER — Ambulatory Visit: Payer: BC Managed Care – PPO | Admitting: Vascular Surgery

## 2023-01-20 ENCOUNTER — Encounter: Payer: Self-pay | Admitting: Vascular Surgery

## 2023-01-20 VITALS — BP 128/89 | HR 76 | Temp 97.9°F | Resp 20 | Ht 60.0 in | Wt 105.0 lb

## 2023-01-20 DIAGNOSIS — I7771 Dissection of carotid artery: Secondary | ICD-10-CM | POA: Insufficient documentation

## 2023-01-20 DIAGNOSIS — I773 Arterial fibromuscular dysplasia: Secondary | ICD-10-CM | POA: Diagnosis not present

## 2023-01-31 DIAGNOSIS — R92333 Mammographic heterogeneous density, bilateral breasts: Secondary | ICD-10-CM | POA: Diagnosis not present

## 2023-01-31 DIAGNOSIS — Z09 Encounter for follow-up examination after completed treatment for conditions other than malignant neoplasm: Secondary | ICD-10-CM | POA: Diagnosis not present

## 2023-02-22 ENCOUNTER — Other Ambulatory Visit: Payer: Self-pay | Admitting: Endocrinology

## 2023-02-22 DIAGNOSIS — J45909 Unspecified asthma, uncomplicated: Secondary | ICD-10-CM | POA: Diagnosis not present

## 2023-02-22 DIAGNOSIS — E041 Nontoxic single thyroid nodule: Secondary | ICD-10-CM | POA: Diagnosis not present

## 2023-02-22 DIAGNOSIS — M810 Age-related osteoporosis without current pathological fracture: Secondary | ICD-10-CM | POA: Diagnosis not present

## 2023-02-25 DIAGNOSIS — N951 Menopausal and female climacteric states: Secondary | ICD-10-CM | POA: Diagnosis not present

## 2023-02-28 ENCOUNTER — Ambulatory Visit
Admission: RE | Admit: 2023-02-28 | Discharge: 2023-02-28 | Disposition: A | Discharge status: Home / Self Care | Payer: BC Managed Care – PPO | Source: Ambulatory Visit | Attending: Endocrinology | Admitting: Endocrinology

## 2023-02-28 DIAGNOSIS — E041 Nontoxic single thyroid nodule: Secondary | ICD-10-CM

## 2023-02-28 DIAGNOSIS — E042 Nontoxic multinodular goiter: Secondary | ICD-10-CM | POA: Diagnosis not present

## 2023-03-02 DIAGNOSIS — M810 Age-related osteoporosis without current pathological fracture: Secondary | ICD-10-CM | POA: Diagnosis not present

## 2023-04-15 DIAGNOSIS — J301 Allergic rhinitis due to pollen: Secondary | ICD-10-CM | POA: Diagnosis not present

## 2023-04-15 DIAGNOSIS — J3089 Other allergic rhinitis: Secondary | ICD-10-CM | POA: Diagnosis not present

## 2023-04-15 DIAGNOSIS — J3081 Allergic rhinitis due to animal (cat) (dog) hair and dander: Secondary | ICD-10-CM | POA: Diagnosis not present

## 2023-04-15 DIAGNOSIS — J4520 Mild intermittent asthma, uncomplicated: Secondary | ICD-10-CM | POA: Diagnosis not present

## 2023-06-10 ENCOUNTER — Emergency Department (HOSPITAL_BASED_OUTPATIENT_CLINIC_OR_DEPARTMENT_OTHER)

## 2023-06-10 ENCOUNTER — Encounter (HOSPITAL_BASED_OUTPATIENT_CLINIC_OR_DEPARTMENT_OTHER): Payer: Self-pay | Admitting: Emergency Medicine

## 2023-06-10 ENCOUNTER — Other Ambulatory Visit: Payer: Self-pay

## 2023-06-10 ENCOUNTER — Ambulatory Visit: Payer: Self-pay

## 2023-06-10 ENCOUNTER — Emergency Department (HOSPITAL_BASED_OUTPATIENT_CLINIC_OR_DEPARTMENT_OTHER)
Admission: EM | Admit: 2023-06-10 | Discharge: 2023-06-10 | Disposition: A | Discharge status: Home / Self Care | Attending: Emergency Medicine | Admitting: Emergency Medicine

## 2023-06-10 DIAGNOSIS — M542 Cervicalgia: Secondary | ICD-10-CM | POA: Diagnosis not present

## 2023-06-10 DIAGNOSIS — Z7982 Long term (current) use of aspirin: Secondary | ICD-10-CM | POA: Diagnosis not present

## 2023-06-10 DIAGNOSIS — Y92410 Unspecified street and highway as the place of occurrence of the external cause: Secondary | ICD-10-CM | POA: Diagnosis not present

## 2023-06-10 DIAGNOSIS — I6523 Occlusion and stenosis of bilateral carotid arteries: Secondary | ICD-10-CM | POA: Diagnosis not present

## 2023-06-10 DIAGNOSIS — I771 Stricture of artery: Secondary | ICD-10-CM | POA: Diagnosis not present

## 2023-06-10 DIAGNOSIS — M7918 Myalgia, other site: Secondary | ICD-10-CM

## 2023-06-10 DIAGNOSIS — M791 Myalgia, unspecified site: Secondary | ICD-10-CM | POA: Diagnosis not present

## 2023-06-10 DIAGNOSIS — E041 Nontoxic single thyroid nodule: Secondary | ICD-10-CM | POA: Diagnosis not present

## 2023-06-10 LAB — CREATININE, SERUM
Creatinine, Ser: 0.71 mg/dL (ref 0.44–1.00)
GFR, Estimated: >60 mL/min

## 2023-06-10 MED ORDER — IOHEXOL 350 MG/ML SOLN
75.0000 mL | Freq: Once | INTRAVENOUS | Status: AC | PRN
  Administered 2023-06-10: 75 mL via INTRAVENOUS

## 2023-06-10 NOTE — Telephone Encounter (Signed)
Pt was seen at the ED today
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
## 2023-06-17 ENCOUNTER — Encounter: Payer: Self-pay | Admitting: Family Medicine

## 2023-06-21 NOTE — Telephone Encounter (Signed)
 Make her an OV so I can examine her. Then we can order what is needed

## 2023-06-24 ENCOUNTER — Ambulatory Visit: Payer: Self-pay | Admitting: Family Medicine

## 2023-06-24 ENCOUNTER — Encounter: Payer: Self-pay | Admitting: Family Medicine

## 2023-06-24 VITALS — BP 118/80 | HR 78 | Temp 98.2°F | Wt 105.0 lb

## 2023-06-24 DIAGNOSIS — I773 Arterial fibromuscular dysplasia: Secondary | ICD-10-CM | POA: Diagnosis not present

## 2023-06-24 DIAGNOSIS — I7771 Dissection of carotid artery: Secondary | ICD-10-CM

## 2023-06-24 DIAGNOSIS — S161XXD Strain of muscle, fascia and tendon at neck level, subsequent encounter: Secondary | ICD-10-CM
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
## 2023-06-29 ENCOUNTER — Encounter: Payer: Self-pay | Admitting: Physical Therapy

## 2023-06-29 ENCOUNTER — Ambulatory Visit: Attending: Family Medicine | Admitting: Physical Therapy

## 2023-06-29 ENCOUNTER — Other Ambulatory Visit: Payer: Self-pay

## 2023-06-29 DIAGNOSIS — M542 Cervicalgia: Secondary | ICD-10-CM | POA: Insufficient documentation

## 2023-06-29 DIAGNOSIS — S161XXD Strain of muscle, fascia and tendon at neck level, subsequent encounter: Secondary | ICD-10-CM | POA: Diagnosis not present

## 2023-06-29 DIAGNOSIS — R252 Cramp and spasm: Secondary | ICD-10-CM | POA: Diagnosis not present

## 2023-06-29 NOTE — Patient Instructions (Signed)

## 2023-07-15 ENCOUNTER — Ambulatory Visit: Payer: Self-pay | Attending: Family Medicine | Admitting: Physical Therapy

## 2023-07-15 ENCOUNTER — Encounter: Payer: Self-pay | Admitting: Physical Therapy

## 2023-07-15 DIAGNOSIS — R293 Abnormal posture: Secondary | ICD-10-CM | POA: Diagnosis not present

## 2023-07-15 DIAGNOSIS — R252 Cramp and spasm: Secondary | ICD-10-CM | POA: Insufficient documentation

## 2023-07-15 DIAGNOSIS — M6281 Muscle weakness (generalized): Secondary | ICD-10-CM | POA: Insufficient documentation

## 2023-07-15 DIAGNOSIS — M542 Cervicalgia: Secondary | ICD-10-CM | POA: Diagnosis not present

## 2023-07-15 NOTE — Patient Instructions (Signed)
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
## 2023-07-20 ENCOUNTER — Other Ambulatory Visit: Payer: Self-pay | Admitting: Family Medicine

## 2023-07-22 ENCOUNTER — Ambulatory Visit

## 2023-07-22 DIAGNOSIS — R293 Abnormal posture: Secondary | ICD-10-CM

## 2023-07-22 DIAGNOSIS — R252 Cramp and spasm: Secondary | ICD-10-CM

## 2023-07-22 DIAGNOSIS — M6281 Muscle weakness (generalized): Secondary | ICD-10-CM

## 2023-07-22 DIAGNOSIS — M542 Cervicalgia: Secondary | ICD-10-CM
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
## 2023-07-27 ENCOUNTER — Ambulatory Visit: Admitting: Physical Therapy

## 2023-07-27 ENCOUNTER — Encounter: Payer: Self-pay | Admitting: Physical Therapy

## 2023-07-27 DIAGNOSIS — R252 Cramp and spasm: Secondary | ICD-10-CM | POA: Diagnosis not present

## 2023-07-27 DIAGNOSIS — M6281 Muscle weakness (generalized): Secondary | ICD-10-CM

## 2023-07-27 DIAGNOSIS — M542 Cervicalgia: Secondary | ICD-10-CM | POA: Diagnosis not present

## 2023-07-27 DIAGNOSIS — R293 Abnormal posture: Secondary | ICD-10-CM
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
## 2023-08-03 ENCOUNTER — Ambulatory Visit: Admitting: Physical Therapy

## 2023-08-03 ENCOUNTER — Encounter: Payer: Self-pay | Admitting: Physical Therapy

## 2023-08-03 DIAGNOSIS — R293 Abnormal posture: Secondary | ICD-10-CM | POA: Diagnosis not present

## 2023-08-03 DIAGNOSIS — M6281 Muscle weakness (generalized): Secondary | ICD-10-CM | POA: Diagnosis not present

## 2023-08-03 DIAGNOSIS — R252 Cramp and spasm: Secondary | ICD-10-CM

## 2023-08-03 DIAGNOSIS — M542 Cervicalgia: Secondary | ICD-10-CM

## 2023-08-09 ENCOUNTER — Ambulatory Visit: Attending: Family Medicine | Admitting: Physical Therapy

## 2023-08-09 ENCOUNTER — Encounter: Payer: Self-pay | Admitting: Physical Therapy

## 2023-08-09 DIAGNOSIS — M542 Cervicalgia: Secondary | ICD-10-CM | POA: Insufficient documentation

## 2023-08-09 DIAGNOSIS — R293 Abnormal posture: Secondary | ICD-10-CM | POA: Insufficient documentation

## 2023-08-09 DIAGNOSIS — M6281 Muscle weakness (generalized): Secondary | ICD-10-CM | POA: Insufficient documentation

## 2023-08-09 DIAGNOSIS — R252 Cramp and spasm: Secondary | ICD-10-CM | POA: Insufficient documentation
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
## 2023-08-17 ENCOUNTER — Encounter: Payer: Self-pay | Admitting: Physical Therapy

## 2023-08-17 ENCOUNTER — Ambulatory Visit: Admitting: Physical Therapy

## 2023-08-17 DIAGNOSIS — R252 Cramp and spasm: Secondary | ICD-10-CM | POA: Diagnosis not present

## 2023-08-17 DIAGNOSIS — R293 Abnormal posture: Secondary | ICD-10-CM | POA: Diagnosis not present

## 2023-08-17 DIAGNOSIS — M542 Cervicalgia: Secondary | ICD-10-CM | POA: Diagnosis not present

## 2023-08-17 DIAGNOSIS — M6281 Muscle weakness (generalized): Secondary | ICD-10-CM

## 2023-08-23 ENCOUNTER — Encounter: Payer: Self-pay | Admitting: Physical Therapy

## 2023-09-13 DIAGNOSIS — H1045 Other chronic allergic conjunctivitis: Secondary | ICD-10-CM | POA: Diagnosis not present

## 2023-09-30 DIAGNOSIS — H1045 Other chronic allergic conjunctivitis: Secondary | ICD-10-CM | POA: Diagnosis not present
